# Patient Record
Sex: Female | Born: 1970 | Race: White | Hispanic: No | Marital: Single | Smoking: Never smoker
Health system: Southern US, Community
[De-identification: ages and names within clinical notes are randomized; demographics above are authoritative.]

## PROBLEM LIST (undated history)

## (undated) DIAGNOSIS — J302 Other seasonal allergic rhinitis: Secondary | ICD-10-CM

## (undated) DIAGNOSIS — T7840XA Allergy, unspecified, initial encounter: Secondary | ICD-10-CM

## (undated) DIAGNOSIS — C73 Malignant neoplasm of thyroid gland: Principal | ICD-10-CM

## (undated) DIAGNOSIS — R112 Nausea with vomiting, unspecified: Secondary | ICD-10-CM

## (undated) DIAGNOSIS — Z9889 Other specified postprocedural states: Secondary | ICD-10-CM

## (undated) HISTORY — DX: Allergy, unspecified, initial encounter: T78.40XA

## (undated) HISTORY — DX: Malignant neoplasm of thyroid gland: C73

---

## 1998-09-10 ENCOUNTER — Other Ambulatory Visit: Admission: RE | Admit: 1998-09-10 | Discharge: 1998-09-10 | Payer: Self-pay | Admitting: Obstetrics and Gynecology

## 1999-08-26 ENCOUNTER — Other Ambulatory Visit: Admission: RE | Admit: 1999-08-26 | Discharge: 1999-08-26 | Payer: Self-pay | Admitting: Obstetrics & Gynecology

## 2000-02-07 ENCOUNTER — Other Ambulatory Visit: Admission: RE | Admit: 2000-02-07 | Discharge: 2000-02-07 | Payer: Self-pay | Admitting: *Deleted

## 2000-09-07 ENCOUNTER — Inpatient Hospital Stay (HOSPITAL_COMMUNITY): Admission: AD | Admit: 2000-09-07 | Discharge: 2000-09-07 | Payer: Self-pay | Admitting: Obstetrics and Gynecology

## 2000-09-07 ENCOUNTER — Encounter: Payer: Self-pay | Admitting: Obstetrics and Gynecology

## 2000-09-21 ENCOUNTER — Inpatient Hospital Stay (HOSPITAL_COMMUNITY): Admission: AD | Admit: 2000-09-21 | Discharge: 2000-09-25 | Payer: Self-pay | Admitting: Obstetrics and Gynecology

## 2000-09-27 ENCOUNTER — Encounter: Admission: RE | Admit: 2000-09-27 | Discharge: 2000-10-27 | Payer: Self-pay | Admitting: Obstetrics and Gynecology

## 2000-10-01 ENCOUNTER — Inpatient Hospital Stay (HOSPITAL_COMMUNITY): Admission: AD | Admit: 2000-10-01 | Discharge: 2000-10-04 | Payer: Self-pay | Admitting: Obstetrics and Gynecology

## 2000-10-05 ENCOUNTER — Inpatient Hospital Stay (HOSPITAL_COMMUNITY): Admission: AD | Admit: 2000-10-05 | Discharge: 2000-10-05 | Payer: Self-pay | Admitting: Obstetrics and Gynecology

## 2000-10-06 ENCOUNTER — Inpatient Hospital Stay (HOSPITAL_COMMUNITY): Admission: AD | Admit: 2000-10-06 | Discharge: 2000-10-06 | Payer: Self-pay | Admitting: Obstetrics and Gynecology

## 2001-06-23 ENCOUNTER — Other Ambulatory Visit: Admission: RE | Admit: 2001-06-23 | Discharge: 2001-06-23 | Payer: Self-pay | Admitting: Obstetrics and Gynecology

## 2003-06-09 ENCOUNTER — Ambulatory Visit (HOSPITAL_COMMUNITY): Admission: RE | Admit: 2003-06-09 | Discharge: 2003-06-09 | Payer: Self-pay | Admitting: Obstetrics and Gynecology

## 2003-09-07 ENCOUNTER — Ambulatory Visit (HOSPITAL_COMMUNITY): Admission: RE | Admit: 2003-09-07 | Discharge: 2003-09-07 | Payer: Self-pay | Admitting: Obstetrics and Gynecology

## 2004-01-11 ENCOUNTER — Inpatient Hospital Stay (HOSPITAL_COMMUNITY): Admission: RE | Admit: 2004-01-11 | Discharge: 2004-01-13 | Payer: Self-pay | Admitting: Obstetrics and Gynecology

## 2004-02-20 ENCOUNTER — Other Ambulatory Visit: Admission: RE | Admit: 2004-02-20 | Discharge: 2004-02-20 | Payer: Self-pay | Admitting: Obstetrics and Gynecology

## 2005-05-21 ENCOUNTER — Other Ambulatory Visit: Admission: RE | Admit: 2005-05-21 | Discharge: 2005-05-21 | Payer: Self-pay | Admitting: Obstetrics and Gynecology

## 2011-03-28 ENCOUNTER — Other Ambulatory Visit: Payer: Self-pay | Admitting: Obstetrics and Gynecology

## 2011-03-28 DIAGNOSIS — E049 Nontoxic goiter, unspecified: Secondary | ICD-10-CM

## 2011-03-31 ENCOUNTER — Ambulatory Visit
Admission: RE | Admit: 2011-03-31 | Discharge: 2011-03-31 | Disposition: A | Discharge status: Home / Self Care | Payer: BC Managed Care – PPO | Source: Ambulatory Visit | Attending: Obstetrics and Gynecology | Admitting: Obstetrics and Gynecology

## 2011-03-31 DIAGNOSIS — E049 Nontoxic goiter, unspecified: Secondary | ICD-10-CM

## 2011-04-16 ENCOUNTER — Ambulatory Visit (INDEPENDENT_AMBULATORY_CARE_PROVIDER_SITE_OTHER): Payer: BC Managed Care – PPO | Admitting: General Surgery

## 2011-04-16 ENCOUNTER — Encounter (INDEPENDENT_AMBULATORY_CARE_PROVIDER_SITE_OTHER): Payer: Self-pay | Admitting: General Surgery

## 2011-04-16 VITALS — BP 134/96 | HR 60 | Temp 97.4°F | Resp 16 | Ht 64.0 in | Wt 232.0 lb

## 2011-04-16 DIAGNOSIS — E042 Nontoxic multinodular goiter: Secondary | ICD-10-CM

## 2011-04-16 HISTORY — PX: BIOPSY THYROID: PRO38

## 2011-04-16 NOTE — Progress Notes (Signed)
Chief Complaint  Patient presents with  . Other    new pt eval of thyroid    HPI Kathy Livingston is a 40 y.o. female.   HPI  40 year old Caucasian female referred by her gynecologist for evaluation of a multi-nodular thyroid. She initially developed a sore throat several weeks ago which prompted her to go to an urgent care clinic. She initially thought she had strep throat. She was placed on antibiotics. She states that the doctor noticed that her thyroid was enlarged and recommended that she followup with her primary care physician. She does not have a primary care physician so she followed up with her gynecologist. Her gynecologist was concerned about the size of her thyroid on physical exam so she was referred for an ultrasound of her neck which revealed bilateral thyroid nodules. Her thyroid function was also checked as well. She is here to discuss her thyroid nodules.  She denies any personal history of neck or chest radiation. She denies any personal or family history of thyroid cancer. Her maternal grandmother had breast cancer. Her father had non-Hodgkin's lymphoma. She denies any diarrhea, constipation, palpitations, weight change, irregular menstrual cycles, facial swelling, fatigue, anxiety, or dry skin. She does not have menstrual periods because of the Mirena IUD.  She denies any trouble swallowing liquids or solids. She denies any voice changes. She states that her neck just still doesn't feel right. She states that it feels like something is there in her neck.  History reviewed. No pertinent past medical history.  Past Surgical History  Procedure Date  . Cesarean section 09/22/00, 01/11/04    History reviewed. No pertinent family history.  Social History History  Substance Use Topics  . Smoking status: Never Smoker   . Smokeless tobacco: Never Used  . Alcohol Use: Yes    No Known Allergies  Current Outpatient Prescriptions  Medication Sig Dispense Refill  .  Pseudoephedrine HCl (SUDAFED PO) Take by mouth as needed.          Review of Systems Review of Systems  Constitutional: Negative for fever, chills, activity change, appetite change and unexpected weight change.  HENT:       See hpi  Eyes: Negative for photophobia and visual disturbance.  Respiratory: Negative for cough, choking, chest tightness and shortness of breath.   Cardiovascular: Negative for chest pain and leg swelling.  Gastrointestinal: Negative for abdominal pain, diarrhea and constipation.  Genitourinary: Negative for dysuria, frequency, vaginal bleeding and difficulty urinating.       Has IUD  Musculoskeletal: Negative.   Neurological: Negative.   Hematological: Negative.   Psychiatric/Behavioral: Negative.     Blood pressure 134/96, pulse 60, temperature 97.4 F (36.3 C), resp. rate 16, height 5\' 4"  (1.626 m), weight 232 lb (105.235 kg).  Physical Exam Physical Exam  Vitals reviewed. Constitutional: She is oriented to person, place, and time. She appears well-developed and well-nourished.       obese  HENT:  Head: Normocephalic and atraumatic.  Eyes: Conjunctivae are normal. No scleral icterus.  Neck: Normal range of motion. Neck supple. No JVD present. No tracheal tenderness present. No tracheal deviation present. Thyromegaly present. No mass present.  Cardiovascular: Normal rate and regular rhythm.   Pulmonary/Chest: Effort normal and breath sounds normal. No stridor. No respiratory distress. She has no wheezes.  Abdominal: Soft. Bowel sounds are normal. She exhibits no distension. There is no tenderness.  Musculoskeletal: Normal range of motion. She exhibits no edema.  Lymphadenopathy:    She  has no cervical adenopathy.  Neurological: She is alert and oriented to person, place, and time. She exhibits normal muscle tone.  Skin: Skin is warm and dry. No rash noted. No erythema.  Psychiatric: She has a normal mood and affect. Her behavior is normal. Judgment and  thought content normal.    Data Reviewed THYROID ULTRASOUND 03/31/11  Technique: Ultrasound examination of the thyroid gland and  adjacent soft tissues was performed.  Comparison: No comparison studies available.   Findings:  The right thyroid lobe measures 6.5 x 1.9 x 2.4 cm. The left lobe  measures 5.4 x 2.1 x 2.0 cm. The isthmus is 1.6 mm in thickness.  Thyroid parenchyma is diffusely heterogeneous. There are numerous  bilateral thyroid nodules.   Dominant right thyroid nodule is seen in the lower pole. This  measures 2.1 x 1.2 x 1.7 cm. It is solid without  microcalcification or hypoechoic halo. Other nodules in the right  lobe measure 13, 14, and 15 mm. These are all solid.   Dominant 2.4 x 1.6 x 2.4 cm solid nodule in the isthmus is noted.   2.2 x 1.3 x 1.6 cm solid nodule in the left lobe shows no evidence  for associated microcalcification.   mpression:  Enlarged thyroid with multiple prominent solid bilateral thyroid  nodules. These changes may be related to a multinodular goiter,  but some nodules meet criteria for a recommendation of tissue  sampling. This recommendation follows the consensus statement:  T3 total 92 TSH 4.44 (upper limits of nml) Free T4 0.99  Assessment    Multinodular Thyroid    Plan    The patient is euthyroid.  We discussed the etiology & management of thyroid nodules. She was given Transport planner.  She may have some mild compression symptoms. I have recommended an ultrasounded guided FNA biopsy of the three dominant thyroid nodules as a first step.  The biopsy results will help US guide where to go next i.e. Surgery vs medical management of her multinodular thyroid.  The main ultrasound feature of these nodules that is concerning is their size.  There are no associated microcalcifications which is reassuring.  She will f/u with me after her biopsy to discuss the results.  [name]. Andrey Campanile, MD, FACS       Gaynelle Adu M 04/16/2011,  11:57 AM

## 2011-04-16 NOTE — Patient Instructions (Signed)
Thyroid Diseases Your thyroid is a butterfly-shaped gland in your neck. It is located just above your collarbone. It is one of your endocrine glands, which make hormones. The thyroid helps set your metabolism. Metabolism is how your body gets energy from the foods you eat.  Millions of people have thyroid diseases. Women experience thyroid problems more often than men. In fact, overactive thyroid problems (hyperthyroidism) occur in 1% of all women. If you have a thyroid disease, your body may use energy more slowly or quickly than it should.  Thyroid problems also include an immune disease where your body reacts against your thyroid gland (called thyroiditis). A different problem involves lumps and bumps (called nodules) that develop in the gland. The nodules are usually, but not always, noncancerous. THE MOST COMMON THYROID PROBLEMS & CAUSES ARE DISCUSSED BELOW There are many causes for thyroid problems. Treatment depends upon the exact diagnosis and includes trying to reset your body's metabolism to a normal rate. Hyperthyroidism Too much thyroid hormone from an overactive thyroid gland is called hyperthyroidism. In hyperthyroidism, the body's metabolism speeds up. One of the most frequent forms of hyperthyroidism is known as Graves' disease. Graves' disease tends to run in families. Although Graves' is thought to be caused by a problem with the immune system, the exact nature of the genetic problem is unknown. Hypothyroidism Too little thyroid hormone from an underactive thyroid gland is called hypothyroidism. In hypothyroidism, the body's metabolism is slowed. Several things can cause this condition. Most causes affect the thyroid gland directly and hurt its ability to make enough hormone.  Rarely, there may be a pituitary gland tumor (located near the base of the brain). The tumor can block the pituitary from producing thyroid-stimulating hormone (TSH). Your body makes TSH to stimulate the thyroid to  work properly. If the pituitary does not make enough TSH, the thyroid fails to make enough hormones needed for good health. Whether the problem is caused by thyroid conditions or by the pituitary gland, the result is that the thyroid is not making enough hormones. Hypothyroidism causes many physical and mental processes to become sluggish. The body consumes less oxygen and produces less body heat. Thyroid Nodules A thyroid nodule is a small swelling or lump in the thyroid gland. They are common. These nodules represent either a growth of thyroid tissue or a fluid-filled cyst. Both form a lump in the thyroid gland. Almost half of all people will have tiny thyroid nodules at some point in their lives. Typically, these are not noticeable until they become large and affect normal thyroid size. Larger nodules that are greater than a half inch across (about 1 centimeter) occur in about 5 percent of people. Although most nodules are not cancerous, people who have them should seek medical care to rule out cancer. Also, some thyroid nodules may produce too much thyroid hormone or become too large. Large nodules or a large gland can interfere with breathing or swallowing or may cause neck discomfort. Other problems Other thyroid problems include cancer and thyroiditis. Thyroiditis is a malfunction of the body's immune system. Normally, the immune system works to defend the body against infection and other problems. When the immune system is not working properly, it may mistakenly attack normal cells, tissues, and organs. Examples of autoimmune diseases are Hashimoto's thyroiditis (which causes low thyroid function) and Graves' disease (which causes excess thyroid function). SYMPTOMS Symptoms vary greatly depending upon the exact type of problem with the thyroid. Hyperthyroidism-is when your thyroid is too active  and makes more thyroid hormone than your body needs. The most common cause is Graves' Disease. Too much  thyroid hormone can cause some or all of the following symptoms:  Anxiety.   Irritability.   Difficulty sleeping.   Fatigue.   A rapid or irregular heartbeat.   A fine tremor of your hands or fingers.   An increase in perspiration.   Sensitivity to heat.   Weight loss, despite normal food intake.   Brittle hair.   Enlargement of your thyroid gland (goiter).   Light menstrual periods.   Frequent bowel movements.  Graves' disease can specifically cause eye and skin problems. The skin problems involve reddening and swelling of the skin, often on your shins and on the top of your feet. Eye problems can include the following:  Excess tearing and sensation of grit or sand in either or both eyes.   Reddened or inflamed eyes.   Widening of the space between your eyelids.   Swelling of the lids and tissues around the eyes.   Light sensitivity.   Ulcers on the cornea.   Double vision.   Limited eye movements.   Blurred or reduced vision.  Hypothyroidism- is when your thyroid gland is not active enough. This is more common than hyperthyroidism. Symptoms can vary a lot depending of the severity of the hormone deficiency. Symptoms may develop over a long period of time and can include several of the following:  Fatigue.   Sluggishness.   Increased sensitivity to cold.   Constipation.   Pale, dry skin.   A puffy face.   Hoarse voice.   High blood cholesterol level.   Unexplained weight gain.   Muscle aches, tenderness and stiffness.   Pain, stiffness or swelling in your joints.   Muscle weakness.   Heavier than normal menstrual periods.   Brittle fingernails and hair.   Depression.  Thyroid Nodules - most do not cause signs or symptoms. Occasionally, some may become so large that you can feel or even see the swelling at the base of your neck. You may realize a lump or swelling is there when you are shaving or putting on makeup. Men might become aware of  a nodule when shirt collars suddenly feel too tight. Some nodules produce too much thyroid hormone. This can produce the same symptoms as hyperthyroidism (see above). Thyroid nodules are seldom cancerous. However, a nodule is more likely to be malignant (cancerous) if it:  Grows quickly or feels hard.   Causes you to become hoarse or to have trouble swallowing or breathing.   Causes enlarged lymph nodes under your jaw or in your neck.  DIAGNOSIS Because there are so many possible thyroid conditions, your caregiver may ask for a number of tests. They will do this in order to narrow down the exact diagnosis. These tests can include:  Blood and antibody tests.   Special thyroid scans using small, safe amounts of radioactive iodine.   Ultrasound of the thyroid gland (particularly if there is a nodule or lump).   Biopsy. This is usually done with a special needle. A needle biopsy is a procedure to obtain a sample of cells from the thyroid. The tissue will be tested in a lab and examined under a microscope.  TREATMENT Treatment depends on the exact diagnosis. Hyperthyroidism  Beta-blockers help relieve many of the symptoms.   Anti-thyroid medications prevent the thyroid from making excess hormones.   Radioactive iodine treatment can destroy overactive thyroid cells. The iodine  can permanently decrease the amount of hormone produced.   Surgery to remove the thyroid gland.   Treatments for eye problems that come from Graves' disease also include medications and special eye surgery, if felt to be appropriate.  Hypothyroidism Thyroid replacement with levothyroxine is the mainstay of treatment. Treatment with thyroid replacement is usually lifelong and will require monitoring and adjustment from time to time. Thyroid Nodules  Watchful waiting. If a small nodule causes no symptoms or signs of cancer on biopsy, then no treatment may be chosen at first. Re-exam and re-checking blood tests would  be the recommended follow-up.   Anti-thyroid medications or radioactive iodine treatment may be recommended if the nodules produce too much thyroid hormone (see Treatment for Hyperthyroidism above).   Alcohol ablation. Injections of small amounts of ethyl alcohol (ethanol) can cause a non-cancerous nodule to shrink in size.   Surgery (see Treatment for Hyperthyroidism above).  HOME CARE INSTRUCTIONS  Take medications as instructed.   Follow through on recommended testing.  SEEK MEDICAL CARE IF:  You feel that you are developing symptoms of Hyperthyroidism or Hypothyroidism as described above.   You develop a new lump/nodule in the neck/thyroid area that you had not noticed before.   You feel that you are having side effects from medicines prescribed.   You develop trouble breathing or swallowing.  SEEK IMMEDIATE MEDICAL CARE IF:  You develop a fever of 102 F (38.9 C) or higher.   You develop severe sweating.   You develop palpitations and/or rapid heart beat.   You develop shortness of breath.   You develop nausea and vomiting.   You develop extreme shakiness.   You develop agitation.   You develop lightheadedness or have a fainting episode.  Document Released: 04/20/2007 Document Re-Released: 09/17/2009 [facility] Patient Information 2011 [place], [place].

## 2011-04-17 NOTE — Progress Notes (Signed)
Addended byLiliana Cline on: 04/17/2011 04:33 PM   Modules accepted: Orders

## 2011-04-23 ENCOUNTER — Ambulatory Visit
Admission: RE | Admit: 2011-04-23 | Discharge: 2011-04-23 | Disposition: A | Discharge status: Home / Self Care | Payer: BC Managed Care – PPO | Source: Ambulatory Visit | Attending: General Surgery | Admitting: General Surgery

## 2011-04-23 ENCOUNTER — Other Ambulatory Visit (HOSPITAL_COMMUNITY)
Admission: RE | Admit: 2011-04-23 | Discharge: 2011-04-23 | Disposition: A | Discharge status: Home / Self Care | Payer: BC Managed Care – PPO | Source: Ambulatory Visit | Attending: Interventional Radiology | Admitting: Interventional Radiology

## 2011-04-23 DIAGNOSIS — E042 Nontoxic multinodular goiter: Secondary | ICD-10-CM

## 2011-04-23 DIAGNOSIS — E049 Nontoxic goiter, unspecified: Secondary | ICD-10-CM | POA: Insufficient documentation

## 2011-04-28 ENCOUNTER — Ambulatory Visit (INDEPENDENT_AMBULATORY_CARE_PROVIDER_SITE_OTHER): Payer: BC Managed Care – PPO | Admitting: Surgery

## 2011-04-29 ENCOUNTER — Telehealth (INDEPENDENT_AMBULATORY_CARE_PROVIDER_SITE_OTHER): Payer: Self-pay | Admitting: General Surgery

## 2011-04-29 NOTE — Telephone Encounter (Signed)
Left message on machine for patient to call back for path results. Needs appt with Dr Andrey Campanile to discuss follow up. Needs 6 month repeat Ultrasound and labs.

## 2011-04-29 NOTE — Telephone Encounter (Signed)
Patient made aware that path is benign. Patient has an appt to discuss follow up on 05/08/11.

## 2011-05-08 ENCOUNTER — Encounter (INDEPENDENT_AMBULATORY_CARE_PROVIDER_SITE_OTHER): Payer: BC Managed Care – PPO | Admitting: General Surgery

## 2011-05-13 ENCOUNTER — Encounter (INDEPENDENT_AMBULATORY_CARE_PROVIDER_SITE_OTHER): Payer: BC Managed Care – PPO | Admitting: General Surgery

## 2011-05-16 ENCOUNTER — Encounter (INDEPENDENT_AMBULATORY_CARE_PROVIDER_SITE_OTHER): Payer: BC Managed Care – PPO | Admitting: General Surgery

## 2011-06-05 ENCOUNTER — Encounter (INDEPENDENT_AMBULATORY_CARE_PROVIDER_SITE_OTHER): Payer: Self-pay | Admitting: General Surgery

## 2011-06-05 ENCOUNTER — Ambulatory Visit (INDEPENDENT_AMBULATORY_CARE_PROVIDER_SITE_OTHER): Payer: BC Managed Care – PPO | Admitting: General Surgery

## 2011-06-05 VITALS — BP 142/90 | HR 76 | Temp 97.7°F | Resp 18 | Ht 64.0 in | Wt 240.6 lb

## 2011-06-05 DIAGNOSIS — E042 Nontoxic multinodular goiter: Secondary | ICD-10-CM

## 2011-06-05 MED ORDER — LEVOTHYROXINE SODIUM 25 MCG PO TABS: 25.0000 ug | ORAL_TABLET | Freq: Every day | ORAL | Status: DC

## 2011-06-05 NOTE — Patient Instructions (Signed)
Start taking the synthroid to help with your thyroid function.  We will check your TSH level in 1 month just to make sure we haven't corrected your thyroid function too much.  We will repeat your ultrasound in 6 months

## 2011-06-05 NOTE — Progress Notes (Signed)
Chief Complaint  Patient presents with  . Routine Post Op    reck thyroid bx and give results 04/16/11    HPI Kathy Livingston is a 40 y.o. female.   HPI  40 year old Caucasian female referred by her gynecologist for evaluation of a multi-nodular thyroid. She initially developed a sore throat several weeks ago which prompted her to go to an urgent care clinic. She initially thought she had strep throat. She was placed on antibiotics. She states that the doctor noticed that her thyroid was enlarged and recommended that she followup with her primary care physician. She does not have a primary care physician so she followed up with her gynecologist. Her gynecologist was concerned about the size of her thyroid on physical exam so she was referred for an ultrasound of her neck which revealed bilateral thyroid nodules. Her thyroid function was also checked as well. She is here to discuss her thyroid nodules.  She denies any personal history of neck or chest radiation. She denies any personal or family history of thyroid cancer. Her maternal grandmother had breast cancer. Her father had non-Hodgkin's lymphoma. She denies any diarrhea, constipation, palpitations, weight change, irregular menstrual cycles, facial swelling, fatigue, anxiety, or dry skin. She does not have menstrual periods because of the Mirena IUD.  She denies any trouble swallowing liquids or solids. She denies any voice changes. She states that her neck just still doesn't feel right. She states that it feels like something is there in her neck.  After her last visit, an u/s guided FNA biopsy was performed and she is here to discuss the results.   History reviewed. No pertinent past medical history.  Past Surgical History  Procedure Date  . Cesarean section 09/22/00, 01/11/04  . Biopsy thyroid 04/16/11    Family History  Problem Relation Age of Onset  . Cancer Father     lymphoma  . Heart disease Paternal Grandmother   . Cancer  Paternal Grandfather     pancreatic and esophageal  . Heart disease Paternal Grandfather     Social History History  Substance Use Topics  . Smoking status: Never Smoker   . Smokeless tobacco: Never Used  . Alcohol Use: Yes    No Known Allergies  Current Outpatient Prescriptions  Medication Sig Dispense Refill  . Pseudoephedrine HCl (SUDAFED PO) Take by mouth as needed.        Marland Kitchen levothyroxine (LEVOTHROID) 25 MCG tablet Take 1 tablet (25 mcg total) by mouth daily.  30 tablet  6    Review of Systems Review of Systems  Constitutional: Negative for fever, chills, activity change, appetite change and unexpected weight change.  HENT:       See hpi  Eyes: Negative for photophobia and visual disturbance.  Respiratory: Negative for cough, choking, chest tightness and shortness of breath.   Cardiovascular: Negative for chest pain and leg swelling.  Gastrointestinal: Negative for abdominal pain, diarrhea and constipation.  Genitourinary: Negative for dysuria, frequency, vaginal bleeding and difficulty urinating.       Has IUD  Musculoskeletal: Negative.   Neurological: Negative.   Hematological: Negative.   Psychiatric/Behavioral: Negative.     Blood pressure 142/90, pulse 76, temperature 97.7 F (36.5 C), temperature source Temporal, resp. rate 18, height 5\' 4"  (1.626 m), weight 240 lb 9.6 oz (109.135 kg).  Physical Exam Physical Exam  Vitals reviewed. Constitutional: She is oriented to person, place, and time. She appears well-developed and well-nourished.       obese  HENT:  Head: Normocephalic and atraumatic.  Eyes: Conjunctivae are normal. No scleral icterus.  Neck: Normal range of motion. Neck supple. No JVD present. No tracheal tenderness present. No tracheal deviation present. Thyromegaly present. No mass present. No hematoma/bruising from recent bx.  Cardiovascular: Normal rate and regular rhythm.   Pulmonary/Chest: Effort normal and breath sounds normal. No stridor.  No respiratory distress. She has no wheezes.  Abdominal: Soft. Bowel sounds are normal. She exhibits no distension. There is no tenderness.  Musculoskeletal: Normal range of motion. She exhibits no edema.  Lymphadenopathy:    She has no cervical adenopathy.  Neurological: She is alert and oriented to person, place, and time. She exhibits normal muscle tone.  Skin: Skin is warm and dry. No rash noted. No erythema.  Psychiatric: She has a normal mood and affect. Her behavior is normal. Judgment and thought content normal.    Data Reviewed THYROID ULTRASOUND 03/31/11  Technique: Ultrasound examination of the thyroid gland and  adjacent soft tissues was performed.  Comparison: No comparison studies available.   Findings:  The right thyroid lobe measures 6.5 x 1.9 x 2.4 cm. The left lobe  measures 5.4 x 2.1 x 2.0 cm. The isthmus is 1.6 mm in thickness.  Thyroid parenchyma is diffusely heterogeneous. There are numerous  bilateral thyroid nodules.   Dominant right thyroid nodule is seen in the lower pole. This  measures 2.1 x 1.2 x 1.7 cm. It is solid without  microcalcification or hypoechoic halo. Other nodules in the right  lobe measure 13, 14, and 15 mm. These are all solid.   Dominant 2.4 x 1.6 x 2.4 cm solid nodule in the isthmus is noted.   2.2 x 1.3 x 1.6 cm solid nodule in the left lobe shows no evidence  for associated microcalcification.   mpression:  Enlarged thyroid with multiple prominent solid bilateral thyroid  nodules. These changes may be related to a multinodular goiter,  but some nodules meet criteria for a recommendation of tissue  sampling. This recommendation follows the consensus statement:  T3 total 92 TSH 4.44 (upper limits of nml) Free T4 0.99  BIOPSY: 05/01/11  Ultrasound was performed to localize and mark an adequate site for  the biopsy. The patient was then prepped and draped in a normal  sterile fashion. Local anesthesia was provided with 1%  lidocaine.  Using direct ultrasound guidance, 4 passes were made using 25 gauge  needles into the nodule within the isthmus of the thyroid.  Ultrasound was used to confirm needle placements on all occasions.  Specimens were sent to Pathology for analysis.  Complications: None  Findings: Sonographic evaluation of the thyroid gland shows  multiple areas of nodularity in both lobes and the isthmus. No  single area is more suspicious for malignancy based on sonographic  morphology. The largest nodule was chosen for biopsy which is the  midline nodule occupying the entire isthmus. This nodule was  remeasured today and is felt to be slightly larger than the  previous measurements obtained with maximal diameter of 2.6-2.7 cm.  This is clearly the largest nodule in the thyroid gland.   IMPRESSION:  Ultrasound guided needle aspirate biopsy performed of the largest  solid thyroid nodule occupying the isthmus. Based on  remeasurement, maximal diameter is 2.6-2.7 cm. A multitude of  other solid nodules are present bilaterally, none of which shows  more suspicious characteristics for malignancy.  PATHOLOGY: Benign, benign follicular   Assessment    Multinodular Thyroid  Plan     We discussed her biopsy results. Given the fact that there are no microcalcifications or other overt suspicious findings on her ultrasound and that the biopsy showed benign follicular cells I recommended close clinical followup in 6 months with a repeat ultrasound at that time. We also discussed the possibility of proceeding with total thyroidectomy at this time. Given the other radiological findings and the FNA results I do not believe a total thyroidectomy is warranted at this time.  Her TSH level was borderline elevated at 4.44. I recommended placing her on a low dose of Synthroid since she is borderline hypothyroid. I placed her on a 50 mcg daily dose. I will recheck a TSH level in one month. We discussed the  signs and symptoms of hyperthyroidism and hypothyroidism. I will see her in 6 months. A TSH level was ordered for one month from now. All of her questions were asked and answered.  [name]. Andrey Campanile, MD, FACS       Gaynelle Adu M 06/05/2011, 5:24 PM

## 2011-06-06 ENCOUNTER — Encounter (INDEPENDENT_AMBULATORY_CARE_PROVIDER_SITE_OTHER): Payer: Self-pay

## 2011-07-25 ENCOUNTER — Telehealth (INDEPENDENT_AMBULATORY_CARE_PROVIDER_SITE_OTHER): Payer: Self-pay

## 2011-07-25 NOTE — Telephone Encounter (Signed)
Pt called stating she had misplaced her lab order for TSH. I refaxed the orders to [facility] again.  She also was concerned because she feels the "node(s) may be growing".  I suggested she wait until her lab results were back and she could speak with Dr. Andrey Campanile or Lesly Rubenstein then about her concerns.

## 2011-07-28 ENCOUNTER — Other Ambulatory Visit (INDEPENDENT_AMBULATORY_CARE_PROVIDER_SITE_OTHER): Payer: Self-pay | Admitting: General Surgery

## 2011-07-29 ENCOUNTER — Telehealth (INDEPENDENT_AMBULATORY_CARE_PROVIDER_SITE_OTHER): Payer: Self-pay | Admitting: General Surgery

## 2011-07-29 NOTE — Telephone Encounter (Signed)
Message copied by Liliana Cline on Tue Jul 29, 2011 11:42 AM ------      Message from: Andrey Campanile, ERIC M      Created: Tue Jul 29, 2011 10:33 AM       pls call pt and tell her TSH level is better and more in the normal range

## 2011-07-29 NOTE — Telephone Encounter (Signed)
Called and made patient aware labs were better. She still wants to see Dr Andrey Campanile to talk about possible removal of thyroid. Feels nodules are bigger. I made appt for patient 09/19/11. She will call with any questions prior.

## 2011-09-05 LAB — HM MAMMOGRAPHY

## 2011-09-05 LAB — HM PAP SMEAR: HM Pap smear: NORMAL

## 2011-09-19 ENCOUNTER — Ambulatory Visit (INDEPENDENT_AMBULATORY_CARE_PROVIDER_SITE_OTHER): Payer: BC Managed Care – PPO | Admitting: General Surgery

## 2011-09-19 ENCOUNTER — Encounter (INDEPENDENT_AMBULATORY_CARE_PROVIDER_SITE_OTHER): Payer: Self-pay | Admitting: General Surgery

## 2011-09-19 VITALS — BP 136/90 | HR 80 | Temp 97.9°F | Resp 18 | Ht 64.0 in | Wt 230.2 lb

## 2011-09-19 DIAGNOSIS — E042 Nontoxic multinodular goiter: Secondary | ICD-10-CM

## 2011-09-19 NOTE — Patient Instructions (Signed)
i will call you with your neck ultrasound results and we will go from there

## 2011-09-20 NOTE — Progress Notes (Signed)
Chief Complaint  Patient presents with  . Thyroid Problem    reck thyroid    HPI Kathy Livingston is a 41 y.o. female.   HPI  41 year old Caucasian female referred by her gynecologist for evaluation of a multi-nodular thyroid. She comes in for followup. She was last seen on 06/05/11. At that time her TSH level was borderline high so we placed her on a low dose of synthroid. She has undergone a biopsy of her largest thyroid nodule which showed benign follicular cells.   She denies any trouble swallowing liquids or solids. She denies any voice changes. She states that she thinks her thyroid may be a little larger. She reports that she may have some hoarseness "when the weather changes".  PMHx, PSHx, SOCHx, FAMHx, ALL reviewed and unchanged  Past Medical History  Diagnosis Date  . Thyroid disease     thyroid nodules    Past Surgical History  Procedure Date  . Cesarean section 09/22/00, 01/11/04  . Biopsy thyroid 04/16/11    Family History  Problem Relation Age of Onset  . Cancer Father     lymphoma  . Heart disease Paternal Grandmother   . Cancer Paternal Grandfather     pancreatic and esophageal  . Heart disease Paternal Grandfather     Social History History  Substance Use Topics  . Smoking status: Never Smoker   . Smokeless tobacco: Never Used  . Alcohol Use: Yes    No Known Allergies  Current Outpatient Prescriptions  Medication Sig Dispense Refill  . levothyroxine (LEVOTHROID) 25 MCG tablet Take 1 tablet (25 mcg total) by mouth daily.  30 tablet  6  . Pseudoephedrine HCl (SUDAFED PO) Take by mouth as needed.          Review of Systems Review of Systems  Constitutional: Negative for fever, chills, activity change, appetite change and unexpected weight change.  HENT:       See hpi  Eyes: Negative for photophobia and visual disturbance.  Respiratory: Negative for cough, choking, chest tightness and shortness of breath.   Cardiovascular: Negative for chest  pain and leg swelling.  Gastrointestinal: Negative for abdominal pain, diarrhea and constipation.  Genitourinary: Negative for dysuria, frequency, vaginal bleeding and difficulty urinating.       Has IUD  Musculoskeletal: Negative.   Neurological: Negative.   Hematological: Negative.   Psychiatric/Behavioral: Negative.     Blood pressure 136/90, pulse 80, temperature 97.9 F (36.6 C), temperature source Temporal, resp. rate 18, height 5\' 4"  (1.626 m), weight 230 lb 3.2 oz (104.418 kg).  Physical Exam Physical Exam  Vitals reviewed. Constitutional: She is oriented to person, place, and time. She appears well-developed and well-nourished.       overweight  HENT:  Head: Normocephalic and atraumatic.  Eyes: Conjunctivae are normal. No scleral icterus.  Neck: Normal range of motion. Neck supple. No JVD present. No tracheal tenderness present. No tracheal deviation present. Thyromegaly present (more so on Rt side) thyroid may be a little larger today than on previous exam. No mass present. Cardiovascular: Normal rate and regular rhythm.   Pulmonary/Chest: Effort normal and breath sounds normal. No stridor. No respiratory distress. She has no wheezes.  Abdominal: Soft. Bowel sounds are normal. She exhibits no distension. There is no tenderness.  Musculoskeletal: Normal range of motion. She exhibits no edema.  Lymphadenopathy:    She has no cervical, occipital, auricular, suprclavicular adenopathy.  Neurological: She is alert and oriented to person, place, and time. She exhibits  normal muscle tone.  Skin: Skin is warm and dry. No rash noted. No erythema.  Psychiatric: She has a normal mood and affect. Her behavior is normal. Judgment and thought content normal.    Data Reviewed: THYROID ULTRASOUND 03/31/11  Technique: Ultrasound examination of the thyroid gland and  adjacent soft tissues was performed.  Comparison: No comparison studies available.   Findings:  The right thyroid lobe  measures 6.5 x 1.9 x 2.4 cm. The left lobe  measures 5.4 x 2.1 x 2.0 cm. The isthmus is 1.6 mm in thickness.  Thyroid parenchyma is diffusely heterogeneous. There are numerous  bilateral thyroid nodules.   Dominant right thyroid nodule is seen in the lower pole. This  measures 2.1 x 1.2 x 1.7 cm. It is solid without  microcalcification or hypoechoic halo. Other nodules in the right  lobe measure 13, 14, and 15 mm. These are all solid.   Dominant 2.4 x 1.6 x 2.4 cm solid nodule in the isthmus is noted.   2.2 x 1.3 x 1.6 cm solid nodule in the left lobe shows no evidence  for associated microcalcification.   mpression:  Enlarged thyroid with multiple prominent solid bilateral thyroid  nodules. These changes may be related to a multinodular goiter,  but some nodules meet criteria for a recommendation of tissue  sampling. This recommendation follows the consensus statement:  T3 total 92 TSH 4.44 (upper limits of nml) Free T4 0.99  BIOPSY: 05/01/11  Ultrasound was performed to localize and mark an adequate site for  the biopsy. The patient was then prepped and draped in a normal  sterile fashion. Local anesthesia was provided with 1% lidocaine.  Using direct ultrasound guidance, 4 passes were made using 25 gauge  needles into the nodule within the isthmus of the thyroid.  Ultrasound was used to confirm needle placements on all occasions.  Specimens were sent to Pathology for analysis.  Complications: None  Findings: Sonographic evaluation of the thyroid gland shows  multiple areas of nodularity in both lobes and the isthmus. No  single area is more suspicious for malignancy based on sonographic  morphology. The largest nodule was chosen for biopsy which is the  midline nodule occupying the entire isthmus. This nodule was  remeasured today and is felt to be slightly larger than the  previous measurements obtained with maximal diameter of 2.6-2.7 cm.  This is clearly the largest  nodule in the thyroid gland.   IMPRESSION:  Ultrasound guided needle aspirate biopsy performed of the largest  solid thyroid nodule occupying the isthmus. Based on  remeasurement, maximal diameter is 2.6-2.7 cm. A multitude of  other solid nodules are present bilaterally, none of which shows  more suspicious characteristics for malignancy.  PATHOLOGY: Benign, benign follicular   TSH level 07/28/11- 2.827  Assessment    Multinodular Thyroid with goiter    Plan     We will order a repeat neck ultrasound to re-evaluate her thyroid and thyroid nodules. If there are new suspicious findings (ie calcifications), I will recommend a repeat biopsy.    She's interested in thyroidectomy at this time.  She would like to wait until the summer so she will not have take time off of work. I recommended to her that we first get the repeat neck u/s. IF there is anything suspicious, then I would recommend proceeding to the OR sooner once the appropriate work-up is compete.  If the ultrasound is reassuring, then I believe waiting till the summer is not  unreasonable.  We will contact her with the results of her u/s and make the appropriate f/u plan based on the results.   [name]. Andrey Campanile, MD, FACS General, Bariatric, & Minimally Invasive Surgery [facility], [place]        [facility] M 09/20/2011, 1:48 PM

## 2011-09-22 ENCOUNTER — Telehealth (INDEPENDENT_AMBULATORY_CARE_PROVIDER_SITE_OTHER): Payer: Self-pay

## 2011-09-22 NOTE — Telephone Encounter (Signed)
Called pt to notify her of the scheduled thyroid US appt that we scheduled at [facility] for 09-24-11 arrive at 4:15 for 4:30.

## 2011-09-24 ENCOUNTER — Other Ambulatory Visit: Payer: BC Managed Care – PPO

## 2011-09-26 ENCOUNTER — Ambulatory Visit
Admission: RE | Admit: 2011-09-26 | Discharge: 2011-09-26 | Disposition: A | Discharge status: Home / Self Care | Payer: BC Managed Care – PPO | Source: Ambulatory Visit | Attending: General Surgery | Admitting: General Surgery

## 2011-09-26 DIAGNOSIS — E042 Nontoxic multinodular goiter: Secondary | ICD-10-CM

## 2011-10-02 ENCOUNTER — Telehealth (INDEPENDENT_AMBULATORY_CARE_PROVIDER_SITE_OTHER): Payer: Self-pay | Admitting: General Surgery

## 2011-10-02 NOTE — Telephone Encounter (Signed)
Message copied by Liliana Cline on Thu Oct 02, 2011 11:18 AM ------      Message from: Kathy Livingston      Created: Thu Oct 02, 2011  9:25 AM      Regarding: test results       Lesly Rubenstein             Will you pls call pt with test results..  Use phone # in epic

## 2011-10-02 NOTE — Telephone Encounter (Signed)
Left message for patient to call back and ask for me 

## 2011-10-06 NOTE — Telephone Encounter (Signed)
Patient made aware no significant change on ultrasound. She will call us this summer when she is ready to schedule.

## 2011-10-15 ENCOUNTER — Encounter (INDEPENDENT_AMBULATORY_CARE_PROVIDER_SITE_OTHER): Payer: Self-pay | Admitting: General Surgery

## 2011-10-17 ENCOUNTER — Other Ambulatory Visit: Payer: Self-pay | Admitting: Obstetrics and Gynecology

## 2011-10-21 ENCOUNTER — Other Ambulatory Visit: Payer: Self-pay | Admitting: Obstetrics and Gynecology

## 2011-10-21 DIAGNOSIS — R928 Other abnormal and inconclusive findings on diagnostic imaging of breast: Secondary | ICD-10-CM

## 2011-10-22 ENCOUNTER — Ambulatory Visit
Admission: RE | Admit: 2011-10-22 | Discharge: 2011-10-22 | Disposition: A | Discharge status: Home / Self Care | Payer: BC Managed Care – PPO | Source: Ambulatory Visit | Attending: Obstetrics and Gynecology | Admitting: Obstetrics and Gynecology

## 2011-10-22 DIAGNOSIS — R928 Other abnormal and inconclusive findings on diagnostic imaging of breast: Secondary | ICD-10-CM

## 2011-10-23 ENCOUNTER — Other Ambulatory Visit: Payer: Self-pay | Admitting: Obstetrics and Gynecology

## 2011-10-23 DIAGNOSIS — N63 Unspecified lump in unspecified breast: Secondary | ICD-10-CM

## 2011-10-24 ENCOUNTER — Other Ambulatory Visit (INDEPENDENT_AMBULATORY_CARE_PROVIDER_SITE_OTHER): Payer: Self-pay | Admitting: General Surgery

## 2011-11-04 ENCOUNTER — Other Ambulatory Visit: Payer: BC Managed Care – PPO

## 2011-11-07 ENCOUNTER — Ambulatory Visit
Admission: RE | Admit: 2011-11-07 | Discharge: 2011-11-07 | Disposition: A | Discharge status: Home / Self Care | Payer: BC Managed Care – PPO | Source: Ambulatory Visit | Attending: Obstetrics and Gynecology | Admitting: Obstetrics and Gynecology

## 2011-11-07 ENCOUNTER — Other Ambulatory Visit: Payer: Self-pay | Admitting: Obstetrics and Gynecology

## 2011-11-07 DIAGNOSIS — N6002 Solitary cyst of left breast: Secondary | ICD-10-CM

## 2011-11-07 DIAGNOSIS — N63 Unspecified lump in unspecified breast: Secondary | ICD-10-CM

## 2011-12-06 DIAGNOSIS — C73 Malignant neoplasm of thyroid gland: Secondary | ICD-10-CM

## 2011-12-06 HISTORY — DX: Malignant neoplasm of thyroid gland: C73

## 2011-12-09 ENCOUNTER — Encounter (HOSPITAL_COMMUNITY): Payer: Self-pay | Admitting: Pharmacy Technician

## 2011-12-10 ENCOUNTER — Ambulatory Visit (INDEPENDENT_AMBULATORY_CARE_PROVIDER_SITE_OTHER): Payer: BC Managed Care – PPO | Admitting: General Surgery

## 2011-12-10 ENCOUNTER — Encounter (INDEPENDENT_AMBULATORY_CARE_PROVIDER_SITE_OTHER): Payer: Self-pay | Admitting: General Surgery

## 2011-12-10 VITALS — BP 138/80 | HR 84 | Resp 16 | Ht 64.0 in | Wt 222.0 lb

## 2011-12-10 DIAGNOSIS — E042 Nontoxic multinodular goiter: Secondary | ICD-10-CM

## 2011-12-10 NOTE — Progress Notes (Signed)
Patient ID: Kathy Livingston, female   DOB: 05-19-1971, 41 y.o.   MRN: [id number]  Chief Complaint  Patient presents with  . Pre-op Exam    thyroid    HPI Kathy Livingston is a 41 y.o. female.   HPI 41 yo WF comes in for LTF to discuss her upcoming total thyroid surgery for her multinodular goiter. She is still taking synthroid. She did have her first mammogram since her visit. It revealed an area of concern which prompted a biopsy which was benign. She denies any fever, chills, diarrhea, constipation, palpitations, new voice changes.    Past Medical History  Diagnosis Date  . Thyroid disease     thyroid nodules    Past Surgical History  Procedure Date  . Cesarean section 09/22/00, 01/11/04  . Biopsy thyroid 04/16/11    Family History  Problem Relation Age of Onset  . Cancer Father     lymphoma  . Heart disease Paternal Grandmother   . Cancer Paternal Grandfather     pancreatic and esophageal  . Heart disease Paternal Grandfather     Social History History  Substance Use Topics  . Smoking status: Never Smoker   . Smokeless tobacco: Never Used  . Alcohol Use: Yes    No Known Allergies  Current Outpatient Prescriptions  Medication Sig Dispense Refill  . acidophilus (RISAQUAD) CAPS Take 2 capsules by mouth daily.      Marland Kitchen levothyroxine (LEVOTHROID) 25 MCG tablet Take 1 tablet (25 mcg total) by mouth daily.  30 tablet  6  . Pseudoephedrine HCl (SUDAFED PO) Take 30 mg by mouth as needed.       . vitamin E 400 UNIT capsule Take 400 Units by mouth daily.        Review of Systems Review of Systems  Constitutional: Negative for fever, chills, activity change, appetite change and unexpected weight change.  HENT: Negative for hearing loss, facial swelling, sneezing, neck pain and postnasal drip.        Some allergy drainage a few weeks ago. Has raspy voice - unchanged  Eyes: Negative for photophobia, pain and visual disturbance.  Respiratory: Negative for chest tightness,  shortness of breath and wheezing.   Cardiovascular: Negative for chest pain and leg swelling.  Gastrointestinal: Negative for nausea, abdominal pain, diarrhea and abdominal distention.  Genitourinary: Negative for dysuria and difficulty urinating.  Musculoskeletal: Negative for back pain.  Skin: Negative for color change and pallor.  Neurological: Negative for tremors, seizures, syncope and light-headedness.  Hematological: Negative for adenopathy. Does not bruise/bleed easily.  Psychiatric/Behavioral: Negative for sleep disturbance and self-injury.    Blood pressure 138/80, pulse 84, resp. rate 16, height 5\' 4"  (1.626 m), weight 222 lb (100.699 kg).  Physical Exam Physical Exam  Vitals reviewed. Constitutional: She is oriented to person, place, and time. She appears well-developed and well-nourished. No distress.  HENT:  Head: Normocephalic and atraumatic.  Right Ear: External ear normal.  Left Ear: External ear normal.  Nose: Nose normal.       Raspy voice  Eyes: Conjunctivae are normal. No scleral icterus.  Neck: Normal range of motion. Neck supple. No tracheal deviation present. Thyromegaly (R>L; mobile) present.    Cardiovascular: Normal rate, regular rhythm and normal heart sounds.   Pulmonary/Chest: Effort normal and breath sounds normal. No stridor. No respiratory distress. She has no wheezes.  Abdominal: Soft. She exhibits no distension.  Musculoskeletal: Normal range of motion. She exhibits no edema and no tenderness.  Lymphadenopathy:  She has no cervical adenopathy.  Neurological: She is alert and oriented to person, place, and time.  Skin: Skin is warm and dry. No rash noted. She is not diaphoretic. No erythema.  Psychiatric: She has a normal mood and affect. Her behavior is normal. Judgment and thought content normal.    Data Reviewed My last note Neck u/s from 03/2011 Thyroid bx results Repeat neck u/s 09/2011: THYROID ULTRASOUND  Technique: Ultrasound  examination of the thyroid gland and adjacent  soft tissues was performed.  Comparison: Motion dated 03/31/2011  Findings:   Right thyroid lobe: 7.4 x 2.0 x 2.4 cm.   Left thyroid lobe: 5.5 x 1.9 x 1.9 cm.   Isthmus: 1.6 cm.   Focal nodules: The patient has multiple solid nodules in the gland  including the isthmus.  The dominant nodule in the right lower pole measures 1.8 x 1.3 x  1.3 cm, slightly smaller than on the prior exam.  Isthmus: The solid nodule in the isthmus measures 2.7 x 1.7 x 2.6  cm., by my measurements essentially unchanged.  Left lobe: Nodule in the left lower pole measures 2.1 x 1.1 x 1.5  cm, essentially unchanged.  Lymphadenopathy: None visualized.   IMPRESSION:  No significant change in the multi nodular goiter. All of the  dominant nodules are essentially unchanged.   Assessment    Multinodular goiter - euthyroid    Plan    Plan total thyroidectomy next week for multinodular goiter.  The risks and benefits of the procedure have been discussed at length with the patient.  The patient understands the proposed procedure, potential alternative treatments, and the course of recovery to be expected.  All of the patient's questions have been answered at this time.  The patient wishes to proceed with surgery. We did discuss the need for life long thyroid replacement and generally the need for temporary sometimes permanent calcium supplementation.  I also explained that her voice will hopefully remain unchanged.   [name]. Andrey Campanile, MD, FACS General, Bariatric, & Minimally Invasive Surgery [facility], [place]        [facility] M 12/10/2011, 5:20 PM

## 2011-12-15 ENCOUNTER — Encounter (HOSPITAL_COMMUNITY)
Admission: RE | Admit: 2011-12-15 | Discharge: 2011-12-15 | Disposition: A | Discharge status: Home / Self Care | Payer: BC Managed Care – PPO | Source: Ambulatory Visit | Attending: General Surgery | Admitting: General Surgery

## 2011-12-15 ENCOUNTER — Encounter (HOSPITAL_COMMUNITY): Payer: Self-pay

## 2011-12-15 ENCOUNTER — Ambulatory Visit (HOSPITAL_COMMUNITY)
Admission: RE | Admit: 2011-12-15 | Discharge: 2011-12-15 | Disposition: A | Discharge status: Home / Self Care | Payer: BC Managed Care – PPO | Source: Ambulatory Visit | Attending: General Surgery | Admitting: General Surgery

## 2011-12-15 DIAGNOSIS — E042 Nontoxic multinodular goiter: Secondary | ICD-10-CM | POA: Insufficient documentation

## 2011-12-15 DIAGNOSIS — Z01812 Encounter for preprocedural laboratory examination: Secondary | ICD-10-CM | POA: Insufficient documentation

## 2011-12-15 HISTORY — DX: Other seasonal allergic rhinitis: J30.2

## 2011-12-15 HISTORY — DX: Other specified postprocedural states: R11.2

## 2011-12-15 HISTORY — DX: Other specified postprocedural states: Z98.890

## 2011-12-15 LAB — BASIC METABOLIC PANEL
Calcium: 9.3 mg/dL (ref 8.4–10.5)
GFR calc non Af Amer: >90 mL/min (ref >90)
Sodium: 141 mEq/L (ref 135–145)

## 2011-12-15 LAB — CBC
Platelets: 326 10*3/uL (ref 150–400)
RBC: 4.71 MIL/uL (ref 3.87–5.11)
RDW: 13.6 % (ref 11.5–15.5)
WBC: 9.4 10*3/uL (ref 4.0–10.5)

## 2011-12-15 LAB — DIFFERENTIAL
Basophils Absolute: 0 10*3/uL (ref 0.0–0.1)
Eosinophils Absolute: 0.1 10*3/uL (ref 0.0–0.7)
Lymphocytes Relative: 32 % (ref 12–46)
Lymphs Abs: 3 10*3/uL (ref 0.7–4.0)
Neutrophils Relative %: 61 % (ref 43–77)

## 2011-12-15 LAB — SURGICAL PCR SCREEN
MRSA, PCR: NEGATIVE
Staphylococcus aureus: POSITIVE — AB

## 2011-12-15 LAB — HCG, SERUM, QUALITATIVE: Preg, Serum: NEGATIVE

## 2011-12-15 NOTE — Pre-Procedure Instructions (Signed)
SURGERY IS TOMORROW--CHART TAKEN TO SHORT STAY WITH A NOTE THAT PT'S PCR, SERUM PREGNANCY, TSH AND BMET RESULTS PENDING.

## 2011-12-15 NOTE — Patient Instructions (Signed)
YOUR SURGERY IS SCHEDULED ON:  Tuesday  6/11  AT 7:30 AM  REPORT TO [facility] AT:  5:30 AM      PHONE # FOR SHORT STAY IS [phone]  DO NOT EAT OR DRINK ANYTHING AFTER MIDNIGHT THE NIGHT BEFORE YOUR SURGERY.  YOU MAY BRUSH YOUR TEETH, RINSE OUT YOUR MOUTH--BUT NO WATER, NO FOOD, NO CHEWING GUM, NO MINTS, NO CANDIES, NO CHEWING TOBACCO.  PLEASE TAKE THE FOLLOWING MEDICATIONS THE AM OF YOUR SURGERY WITH A FEW SIPS OF WATER:  LEVOTHYROXINE    IF YOU USE INHALERS--USE YOUR INHALERS THE AM OF YOUR SURGERY AND BRING INHALERS TO THE HOSPITAL -TAKE TO SURGERY.    IF YOU ARE DIABETIC:  DO NOT TAKE ANY DIABETIC MEDICATIONS THE AM OF YOUR SURGERY.  IF YOU TAKE INSULIN IN THE EVENINGS--PLEASE ONLY TAKE 1/2 NORMAL EVENING DOSE THE NIGHT BEFORE YOUR SURGERY.  NO INSULIN THE AM OF YOUR SURGERY.  IF YOU HAVE SLEEP APNEA AND USE CPAP OR BIPAP--PLEASE BRING THE MASK --NOT THE MACHINE-NOT THE TUBING   -JUST THE MASK. DO NOT BRING VALUABLES, MONEY, CREDIT CARDS.  CONTACT LENS, DENTURES / PARTIALS, GLASSES SHOULD NOT BE WORN TO SURGERY AND IN MOST CASES-HEARING AIDS WILL NEED TO BE REMOVED.  BRING YOUR GLASSES CASE, ANY EQUIPMENT NEEDED FOR YOUR CONTACT LENS. FOR PATIENTS ADMITTED TO THE HOSPITAL--CHECK OUT TIME THE DAY OF DISCHARGE IS 11:00 AM.  ALL INPATIENT ROOMS ARE PRIVATE - WITH BATHROOM, TELEPHONE, TELEVISION AND WIFI INTERNET. IF YOU ARE BEING DISCHARGED THE SAME DAY OF YOUR SURGERY--YOU CAN NOT DRIVE YOURSELF HOME--AND SHOULD NOT GO HOME ALONE BY TAXI OR BUS.  NO DRIVING OR OPERATING MACHINERY FOR 24 HOURS FOLLOWING ANESTHESIA / PAIN MEDICATIONS.                            SPECIAL INSTRUCTIONS:  CHLORHEXIDINE SOAP SHOWER (other brand names are Betasept and Hibiclens ) PLEASE SHOWER WITH CHLORHEXIDINE THE NIGHT BEFORE YOUR SURGERY AND THE AM OF YOUR SURGERY. DO NOT USE CHLORHEXIDINE ON YOUR FACE OR PRIVATE AREAS--YOU MAY USE YOUR NORMAL SOAP THOSE AREAS AND YOUR NORMAL SHAMPOO.    WOMEN SHOULD AVOID SHAVING UNDER ARMS AND SHAVING LEGS 48 HOURS BEFORE USING CHLORHEXIDINE TO AVOID SKIN IRRITATION.  DO NOT USE IF ALLERGIC TO CHLORHEXIDINE.  PLEASE READ OVER ANY  FACT SHEETS THAT YOU WERE GIVEN: MRSA INFORMATION

## 2011-12-16 ENCOUNTER — Ambulatory Visit (HOSPITAL_COMMUNITY): Payer: BC Managed Care – PPO | Admitting: Anesthesiology

## 2011-12-16 ENCOUNTER — Encounter (HOSPITAL_COMMUNITY)
Admission: RE | Disposition: A | Discharge status: Home / Self Care | Payer: Self-pay | Source: Ambulatory Visit | Attending: General Surgery

## 2011-12-16 ENCOUNTER — Ambulatory Visit (HOSPITAL_COMMUNITY)
Admission: RE | Admit: 2011-12-16 | Discharge: 2011-12-17 | Disposition: A | Discharge status: Home / Self Care | Payer: BC Managed Care – PPO | Source: Ambulatory Visit | Attending: General Surgery | Admitting: General Surgery

## 2011-12-16 ENCOUNTER — Encounter (HOSPITAL_COMMUNITY): Payer: Self-pay | Admitting: *Deleted

## 2011-12-16 ENCOUNTER — Encounter (HOSPITAL_COMMUNITY): Payer: Self-pay | Admitting: Anesthesiology

## 2011-12-16 DIAGNOSIS — E042 Nontoxic multinodular goiter: Secondary | ICD-10-CM | POA: Insufficient documentation

## 2011-12-16 DIAGNOSIS — Z01812 Encounter for preprocedural laboratory examination: Secondary | ICD-10-CM | POA: Insufficient documentation

## 2011-12-16 DIAGNOSIS — C73 Malignant neoplasm of thyroid gland: Secondary | ICD-10-CM

## 2011-12-16 DIAGNOSIS — Z79899 Other long term (current) drug therapy: Secondary | ICD-10-CM | POA: Insufficient documentation

## 2011-12-16 HISTORY — PX: THYROIDECTOMY: SHX17

## 2011-12-16 LAB — TSH: TSH: 2.89 u[IU]/mL (ref 0.350–4.500)

## 2011-12-16 SURGERY — THYROIDECTOMY
Anesthesia: General | Site: Neck | Wound class: Clean

## 2011-12-16 MED ORDER — BUPIVACAINE HCL (PF) 0.25 % IJ SOLN
INTRAMUSCULAR | Status: AC
  Filled 2011-12-16: qty 30

## 2011-12-16 MED ORDER — ONDANSETRON HCL 4 MG/2ML IJ SOLN: 4.0000 mg | Freq: Four times a day (QID) | INTRAMUSCULAR | Status: DC | PRN

## 2011-12-16 MED ORDER — FENTANYL CITRATE 0.05 MG/ML IJ SOLN
INTRAMUSCULAR | Status: DC | PRN
  Administered 2011-12-16: 100 ug via INTRAVENOUS
  Administered 2011-12-16 (×3): 50 ug via INTRAVENOUS
  Administered 2011-12-16: 25 ug via INTRAVENOUS
  Administered 2011-12-16: 100 ug via INTRAVENOUS
  Administered 2011-12-16: 50 ug via INTRAVENOUS
  Administered 2011-12-16: 25 ug via INTRAVENOUS

## 2011-12-16 MED ORDER — DIPHENHYDRAMINE HCL 50 MG/ML IJ SOLN
INTRAMUSCULAR | Status: AC
  Filled 2011-12-16: qty 1

## 2011-12-16 MED ORDER — LIDOCAINE HCL (CARDIAC) 20 MG/ML IV SOLN
INTRAVENOUS | Status: DC | PRN
  Administered 2011-12-16: 50 mg via INTRAVENOUS

## 2011-12-16 MED ORDER — ACETAMINOPHEN 10 MG/ML IV SOLN
INTRAVENOUS | Status: AC
  Filled 2011-12-16: qty 100

## 2011-12-16 MED ORDER — CHLORHEXIDINE GLUCONATE 4 % EX LIQD: 1.0000 "application " | Freq: Once | CUTANEOUS | Status: DC

## 2011-12-16 MED ORDER — DIPHENHYDRAMINE HCL 50 MG/ML IJ SOLN
12.5000 mg | Freq: Once | INTRAMUSCULAR | Status: AC
  Administered 2011-12-16: 12.5 mg via INTRAVENOUS

## 2011-12-16 MED ORDER — NEOSTIGMINE METHYLSULFATE 1 MG/ML IJ SOLN
INTRAMUSCULAR | Status: DC | PRN
  Administered 2011-12-16: 4 mg via INTRAVENOUS

## 2011-12-16 MED ORDER — KCL IN DEXTROSE-NACL 20-5-0.45 MEQ/L-%-% IV SOLN
INTRAVENOUS | Status: DC
  Administered 2011-12-16 (×2): via INTRAVENOUS
  Filled 2011-12-16 (×3): qty 1000

## 2011-12-16 MED ORDER — GLYCOPYRROLATE 0.2 MG/ML IJ SOLN
INTRAMUSCULAR | Status: DC | PRN
  Administered 2011-12-16: .6 mg via INTRAVENOUS

## 2011-12-16 MED ORDER — LEVOTHYROXINE SODIUM 75 MCG PO TABS
75.0000 ug | ORAL_TABLET | Freq: Every day | ORAL | Status: DC
  Administered 2011-12-17: 75 ug via ORAL
  Filled 2011-12-16 (×2): qty 1

## 2011-12-16 MED ORDER — KCL IN DEXTROSE-NACL 20-5-0.45 MEQ/L-%-% IV SOLN
INTRAVENOUS | Status: AC
  Filled 2011-12-16: qty 1000

## 2011-12-16 MED ORDER — ROCURONIUM BROMIDE 100 MG/10ML IV SOLN
INTRAVENOUS | Status: DC | PRN
  Administered 2011-12-16: 10 mg via INTRAVENOUS
  Administered 2011-12-16: 45 mg via INTRAVENOUS

## 2011-12-16 MED ORDER — MUPIROCIN 2 % EX OINT
TOPICAL_OINTMENT | CUTANEOUS | Status: AC
  Filled 2011-12-16: qty 22

## 2011-12-16 MED ORDER — PROPOFOL 10 MG/ML IV BOLUS
INTRAVENOUS | Status: DC | PRN
  Administered 2011-12-16: 180 mg via INTRAVENOUS

## 2011-12-16 MED ORDER — DEXAMETHASONE SODIUM PHOSPHATE 10 MG/ML IJ SOLN
INTRAMUSCULAR | Status: DC | PRN
  Administered 2011-12-16: 8 mg via INTRAVENOUS

## 2011-12-16 MED ORDER — BUPIVACAINE-EPINEPHRINE PF 0.25-1:200000 % IJ SOLN
INTRAMUSCULAR | Status: DC | PRN
  Administered 2011-12-16: 14 mL

## 2011-12-16 MED ORDER — PHENOL 1.4 % MT LIQD
1.0000 | OROMUCOSAL | Status: DC | PRN
  Filled 2011-12-16: qty 177

## 2011-12-16 MED ORDER — FENTANYL CITRATE 0.05 MG/ML IJ SOLN
25.0000 ug | INTRAMUSCULAR | Status: DC | PRN
  Administered 2011-12-16 (×2): 50 ug via INTRAVENOUS

## 2011-12-16 MED ORDER — ONDANSETRON HCL 4 MG/2ML IJ SOLN
INTRAMUSCULAR | Status: DC | PRN
  Administered 2011-12-16: 4 mg via INTRAVENOUS

## 2011-12-16 MED ORDER — CEFAZOLIN SODIUM-DEXTROSE 2-3 GM-% IV SOLR
2.0000 g | INTRAVENOUS | Status: AC
  Administered 2011-12-16: 2 g via INTRAVENOUS

## 2011-12-16 MED ORDER — OXYCODONE-ACETAMINOPHEN 5-325 MG PO TABS
1.0000 | ORAL_TABLET | ORAL | Status: DC | PRN
  Administered 2011-12-16 – 2011-12-17 (×2): 1 via ORAL
  Filled 2011-12-16 (×2): qty 1

## 2011-12-16 MED ORDER — MORPHINE SULFATE 2 MG/ML IJ SOLN: 1.0000 mg | INTRAMUSCULAR | Status: DC | PRN

## 2011-12-16 MED ORDER — MENTHOL 3 MG MT LOZG
1.0000 | LOZENGE | OROMUCOSAL | Status: DC | PRN
  Filled 2011-12-16: qty 9

## 2011-12-16 MED ORDER — ACETAMINOPHEN 10 MG/ML IV SOLN
INTRAVENOUS | Status: DC | PRN
  Administered 2011-12-16: 1000 mg via INTRAVENOUS

## 2011-12-16 MED ORDER — CALCIUM CARBONATE ANTACID 500 MG PO CHEW
400.0000 mg | CHEWABLE_TABLET | Freq: Three times a day (TID) | ORAL | Status: DC
  Administered 2011-12-16 – 2011-12-17 (×2): 400 mg via ORAL
  Filled 2011-12-16 (×5): qty 2

## 2011-12-16 MED ORDER — MIDAZOLAM HCL 5 MG/5ML IJ SOLN
INTRAMUSCULAR | Status: DC | PRN
  Administered 2011-12-16: 2 mg via INTRAVENOUS

## 2011-12-16 MED ORDER — FENTANYL CITRATE 0.05 MG/ML IJ SOLN
INTRAMUSCULAR | Status: AC
  Filled 2011-12-16: qty 2

## 2011-12-16 MED ORDER — ONDANSETRON HCL 4 MG PO TABS: 4.0000 mg | ORAL_TABLET | Freq: Four times a day (QID) | ORAL | Status: DC | PRN

## 2011-12-16 MED ORDER — METOCLOPRAMIDE HCL 5 MG/ML IJ SOLN
INTRAMUSCULAR | Status: DC | PRN
  Administered 2011-12-16: 10 mg via INTRAVENOUS

## 2011-12-16 MED ORDER — LACTATED RINGERS IV SOLN
INTRAVENOUS | Status: DC | PRN
  Administered 2011-12-16 (×2): via INTRAVENOUS

## 2011-12-16 MED ORDER — BUPIVACAINE-EPINEPHRINE PF 0.25-1:200000 % IJ SOLN
INTRAMUSCULAR | Status: AC
  Filled 2011-12-16: qty 30

## 2011-12-16 SURGICAL SUPPLY — 48 items
APL SKNCLS STERI-STRIP NONHPOA (GAUZE/BANDAGES/DRESSINGS) ×1
ATTRACTOMAT 16X20 MAGNETIC DRP (DRAPES) ×2 IMPLANT
BENZOIN TINCTURE PRP APPL 2/3 (GAUZE/BANDAGES/DRESSINGS) ×2 IMPLANT
BLADE HEX COATED 2.75 (ELECTRODE) ×2 IMPLANT
BLADE SURG 15 STRL LF DISP TIS (BLADE) ×1 IMPLANT
BLADE SURG 15 STRL SS (BLADE) ×2
CANISTER SUCTION 2500CC (MISCELLANEOUS) ×2 IMPLANT
CHLORAPREP W/TINT 10.5 ML (MISCELLANEOUS) ×2 IMPLANT
CLIP TI MEDIUM 6 (CLIP) ×10 IMPLANT
CLIP TI WIDE RED SMALL 6 (CLIP) ×10 IMPLANT
CLOTH BEACON ORANGE TIMEOUT ST (SAFETY) ×2 IMPLANT
DISSECTOR ROUND CHERRY 3/8 STR (MISCELLANEOUS) ×2 IMPLANT
DRAPE PED LAPAROTOMY (DRAPES) ×2 IMPLANT
DRAPE UTILITY XL STRL (DRAPES) ×2 IMPLANT
DRESSING SURGICEL FIBRLLR 1X2 (HEMOSTASIS) ×1 IMPLANT
DRSG SURGICEL FIBRILLAR 1X2 (HEMOSTASIS) ×2
DRSG TEGADERM 4X4.75 (GAUZE/BANDAGES/DRESSINGS) ×2 IMPLANT
ELECT COATED BLADE 2.86 ST (ELECTRODE) ×2 IMPLANT
ELECT REM PT RETURN 9FT ADLT (ELECTROSURGICAL) ×2
ELECTRODE REM PT RTRN 9FT ADLT (ELECTROSURGICAL) ×1 IMPLANT
GAUZE SPONGE 4X4 16PLY XRAY LF (GAUZE/BANDAGES/DRESSINGS) ×4 IMPLANT
GLOVE BIOGEL M STRL SZ7.5 (GLOVE) ×2 IMPLANT
GLOVE BIOGEL PI IND STRL 7.0 (GLOVE) ×1 IMPLANT
GLOVE BIOGEL PI INDICATOR 7.0 (GLOVE) ×1
GLOVE INDICATOR 8.0 STRL GRN (GLOVE) ×2 IMPLANT
GLOVE SURG SIGNA 7.5 PF LTX (GLOVE) ×2 IMPLANT
GOWN STRL NON-REIN LRG LVL3 (GOWN DISPOSABLE) ×4 IMPLANT
GOWN STRL REIN XL XLG (GOWN DISPOSABLE) ×4 IMPLANT
KIT BASIN OR (CUSTOM PROCEDURE TRAY) ×2 IMPLANT
NEEDLE HYPO 22GX1.5 SAFETY (NEEDLE) ×2 IMPLANT
NS IRRIG 1000ML POUR BTL (IV SOLUTION) ×2 IMPLANT
PACK BASIC VI WITH GOWN DISP (CUSTOM PROCEDURE TRAY) ×2 IMPLANT
PEN SKIN MARKING BROAD (MISCELLANEOUS) ×2 IMPLANT
PENCIL BUTTON HOLSTER BLD 10FT (ELECTRODE) ×2 IMPLANT
SHEARS HARMONIC 9CM CVD (BLADE) ×2 IMPLANT
SPONGE GAUZE 4X4 12PLY (GAUZE/BANDAGES/DRESSINGS) ×2 IMPLANT
STAPLER VISISTAT 35W (STAPLE) ×2 IMPLANT
STRIP CLOSURE SKIN 1/2X4 (GAUZE/BANDAGES/DRESSINGS) ×2 IMPLANT
SUT MNCRL AB 4-0 PS2 18 (SUTURE) ×2 IMPLANT
SUT SILK 2 0 (SUTURE) ×2
SUT SILK 2-0 18XBRD TIE 12 (SUTURE) ×1 IMPLANT
SUT SILK 3 0 (SUTURE)
SUT SILK 3-0 18XBRD TIE 12 (SUTURE) IMPLANT
SUT VIC AB 3-0 SH 18 (SUTURE) ×2 IMPLANT
SYR BULB IRRIGATION 50ML (SYRINGE) ×2 IMPLANT
SYR CONTROL 10ML LL (SYRINGE) ×2 IMPLANT
TOWEL OR 17X26 10 PK STRL BLUE (TOWEL DISPOSABLE) ×2 IMPLANT
YANKAUER SUCT BULB TIP 10FT TU (MISCELLANEOUS) ×2 IMPLANT

## 2011-12-16 NOTE — Progress Notes (Signed)
Report given to Jacki Cones, R.N. For lunch relief

## 2011-12-16 NOTE — Anesthesia Preprocedure Evaluation (Signed)
Anesthesia Evaluation  Patient identified by MRN, date of birth, ID band Patient awake    Reviewed: Allergy & Precautions, H&P , NPO status , Patient's Chart, lab work & pertinent test results, reviewed documented beta blocker date and time   History of Anesthesia Complications (+) PONV  Airway Mallampati: II TM Distance: >3 FB Neck ROM: Full    Dental  (+) Teeth Intact and Dental Advisory Given   Pulmonary neg pulmonary ROS,  breath sounds clear to auscultation        Cardiovascular negative cardio ROS  Rhythm:Regular Rate:Normal     Neuro/Psych negative neurological ROS  negative psych ROS   GI/Hepatic negative GI ROS, Neg liver ROS,   Endo/Other  Goiter Denies dysphagia + hoarseness  Renal/GU negative Renal ROS  negative genitourinary   Musculoskeletal negative musculoskeletal ROS (+)   Abdominal   Peds negative pediatric ROS (+)  Hematology negative hematology ROS (+)   Anesthesia Other Findings   Reproductive/Obstetrics negative OB ROS                           Anesthesia Physical Anesthesia Plan  ASA: II  Anesthesia Plan: General   Post-op Pain Management:    Induction: Intravenous  Airway Management Planned: Oral ETT  Additional Equipment:   Intra-op Plan:   Post-operative Plan: Extubation in OR  Informed Consent: I have reviewed the patients History and Physical, chart, labs and discussed the procedure including the risks, benefits and alternatives for the proposed anesthesia with the patient or authorized representative who has indicated his/her understanding and acceptance.   Dental advisory given  Plan Discussed with: CRNA and Surgeon  Anesthesia Plan Comments:         Anesthesia Quick Evaluation

## 2011-12-16 NOTE — Progress Notes (Signed)
Medication for generalized itching all over given- no rash nor whelps noted.

## 2011-12-16 NOTE — H&P (View-Only) (Signed)
Patient ID: Kathy Livingston, female   DOB: 01/24/1971, 41 y.o.   MRN: [id number]  Chief Complaint  Patient presents with  . Pre-op Exam    thyroid    HPI Kathy Livingston is a 41 y.o. female.   HPI 41 yo WF comes in for LTF to discuss her upcoming total thyroid surgery for her multinodular goiter. She is still taking synthroid. She did have her first mammogram since her visit. It revealed an area of concern which prompted a biopsy which was benign. She denies any fever, chills, diarrhea, constipation, palpitations, new voice changes.    Past Medical History  Diagnosis Date  . Thyroid disease     thyroid nodules    Past Surgical History  Procedure Date  . Cesarean section 09/22/00, 01/11/04  . Biopsy thyroid 04/16/11    Family History  Problem Relation Age of Onset  . Cancer Father     lymphoma  . Heart disease Paternal Grandmother   . Cancer Paternal Grandfather     pancreatic and esophageal  . Heart disease Paternal Grandfather     Social History History  Substance Use Topics  . Smoking status: Never Smoker   . Smokeless tobacco: Never Used  . Alcohol Use: Yes    No Known Allergies  Current Outpatient Prescriptions  Medication Sig Dispense Refill  . acidophilus (RISAQUAD) CAPS Take 2 capsules by mouth daily.      . levothyroxine (LEVOTHROID) 25 MCG tablet Take 1 tablet (25 mcg total) by mouth daily.  30 tablet  6  . Pseudoephedrine HCl (SUDAFED PO) Take 30 mg by mouth as needed.       . vitamin E 400 UNIT capsule Take 400 Units by mouth daily.        Review of Systems Review of Systems  Constitutional: Negative for fever, chills, activity change, appetite change and unexpected weight change.  HENT: Negative for hearing loss, facial swelling, sneezing, neck pain and postnasal drip.        Some allergy drainage a few weeks ago. Has raspy voice - unchanged  Eyes: Negative for photophobia, pain and visual disturbance.  Respiratory: Negative for chest tightness,  shortness of breath and wheezing.   Cardiovascular: Negative for chest pain and leg swelling.  Gastrointestinal: Negative for nausea, abdominal pain, diarrhea and abdominal distention.  Genitourinary: Negative for dysuria and difficulty urinating.  Musculoskeletal: Negative for back pain.  Skin: Negative for color change and pallor.  Neurological: Negative for tremors, seizures, syncope and light-headedness.  Hematological: Negative for adenopathy. Does not bruise/bleed easily.  Psychiatric/Behavioral: Negative for sleep disturbance and self-injury.    Blood pressure 138/80, pulse 84, resp. rate 16, height 5' 4" (1.626 m), weight 222 lb (100.699 kg).  Physical Exam Physical Exam  Vitals reviewed. Constitutional: She is oriented to person, place, and time. She appears well-developed and well-nourished. No distress.  HENT:  Head: Normocephalic and atraumatic.  Right Ear: External ear normal.  Left Ear: External ear normal.  Nose: Nose normal.       Raspy voice  Eyes: Conjunctivae are normal. No scleral icterus.  Neck: Normal range of motion. Neck supple. No tracheal deviation present. Thyromegaly (R>L; mobile) present.    Cardiovascular: Normal rate, regular rhythm and normal heart sounds.   Pulmonary/Chest: Effort normal and breath sounds normal. No stridor. No respiratory distress. She has no wheezes.  Abdominal: Soft. She exhibits no distension.  Musculoskeletal: Normal range of motion. She exhibits no edema and no tenderness.  Lymphadenopathy:      She has no cervical adenopathy.  Neurological: She is alert and oriented to person, place, and time.  Skin: Skin is warm and dry. No rash noted. She is not diaphoretic. No erythema.  Psychiatric: She has a normal mood and affect. Her behavior is normal. Judgment and thought content normal.    Data Reviewed My last note Neck u/s from 03/2011 Thyroid bx results Repeat neck u/s 09/2011: THYROID ULTRASOUND  Technique: Ultrasound  examination of the thyroid gland and adjacent  soft tissues was performed.  Comparison: Motion dated 03/31/2011  Findings:   Right thyroid lobe: 7.4 x 2.0 x 2.4 cm.   Left thyroid lobe: 5.5 x 1.9 x 1.9 cm.   Isthmus: 1.6 cm.   Focal nodules: The patient has multiple solid nodules in the gland  including the isthmus.  The dominant nodule in the right lower pole measures 1.8 x 1.3 x  1.3 cm, slightly smaller than on the prior exam.  Isthmus: The solid nodule in the isthmus measures 2.7 x 1.7 x 2.6  cm., by my measurements essentially unchanged.  Left lobe: Nodule in the left lower pole measures 2.1 x 1.1 x 1.5  cm, essentially unchanged.  Lymphadenopathy: None visualized.   IMPRESSION:  No significant change in the multi nodular goiter. All of the  dominant nodules are essentially unchanged.   Assessment    Multinodular goiter - euthyroid    Plan    Plan total thyroidectomy next week for multinodular goiter.  The risks and benefits of the procedure have been discussed at length with the patient.  The patient understands the proposed procedure, potential alternative treatments, and the course of recovery to be expected.  All of the patient's questions have been answered at this time.  The patient wishes to proceed with surgery. We did discuss the need for life long thyroid replacement and generally the need for temporary sometimes permanent calcium supplementation.  I also explained that her voice will hopefully remain unchanged.   Danajah Birdsell M. Emileo Semel, MD, FACS General, Bariatric, & Minimally Invasive Surgery [facility] [place]        Morgan Keinath M 12/10/2011, 5:20 PM    

## 2011-12-16 NOTE — Op Note (Signed)
NAMECHENNEL, OLIVOS NO.:  [id number]  MEDICAL RECORD NO.:  [id number]  LOCATION:  1522                         FACILITY:  [facility]  PHYSICIAN:  [name]. Andrey Campanile, MD, FACSDATE OF BIRTH:  1970-08-24  DATE OF PROCEDURE: DATE OF DISCHARGE:                              OPERATIVE REPORT   PREOPERATIVE DIAGNOSIS:  Multinodular goiter.  POSTOPERATIVE DIAGNOSIS:  Multinodular goiter.  PROCEDURE:  Total thyroidectomy.  SURGEON:  [name]. Andrey Campanile, MD, FACS  ASSISTANT SURGEON:  Velora Heckler, MD, FACS  ANESTHESIA:  General plus local consisting of 0.25% Marcaine with epinephrine.  ESTIMATED BLOOD LOSS:  Minimal.  SPECIMEN:  Total thyroid - stitch marked right superior pole.  INDICATIONS FOR PROCEDURE:  The patient is a very pleasant 41 year old female who has a multinodular goiter.  The goiter has increased in size despite thyroid suppression medication.  She underwent a biopsy of the largest nodule, which demonstrated follicular cells.  She was symptomatic with respect to her goiter and she desired thyroidectomy. We discussed at length the risks and benefits, including but not limited to bleeding, infection, injury to surrounding structures, neck hematoma, scarring, blood clot formation, general anesthesia risk, injury to the recurrent laryngeal nerve, loss of voice projection, change in voice, the need for permanent thyroid supplementation and possible injury to the parathyroid glands and low calcium levels requiring calcium supplementation.  She elected to proceed with surgery.  DESCRIPTION OF PROCEDURE:  After obtaining informed consent, the patient was taken to the operating room #11 at [facility].  She was placed supine on the operating room table.  Sequential compression devices were placed.  General endotracheal anesthesia was established. A roll was placed behind her shoulders to slightly hyperextend her neck. Her neck and upper chest were prepped  and draped in the usual standard surgical manner with ChloraPrep.  A 6 cm Kocher incision was made about 1 fingerbreadth above the sternal notch.  I then divided the subcutaneous tissue.  The platysma was divided with electrocautery.  I then raised platysmal flaps both superior and inferiorly.  A self- retaining retractor was placed.  We then parted the median raphae.  The central compartment bilaterally contained no identifiable lymphadenopathy to palpation and inspection.  The patient had a nodule in the isthmus as well as in the right lobe.  Then beginning on the right, I sequentially mobilized the right thyroid lobe starting up at the superior pole.  I gradually rotated the lobe medially dividing its blood supply between clips and Harmonic scalpel.  The capsule was not entered during the dissection.  I identified and preserved the right superior parathyroid gland. The right inferior parathyroid gland was not identified.  We identified the recurrent laryngeal nerve on the right.  We also took down the ligament of Berry. When the right lobe was circumferentially mobilized I dissected the isthmus away from the anterior trachea taking the short pyramidal lobe in continuity.  This was again done with a combination of clips and Harmonic scalpel.  I then turned my attention to the left side of the neck.  The strap muscles were lifted off the left lobe of the thyroid. I then started my dissection at the  superior pole.  I identified and preserved the normal appearing left superior parathyroid gland, dissecting it free from the posterior thyroid capsule.  I identified and preserved the left recurrent laryngeal nerve as we rotated the gland medially.  Electrical current was not placed near the recurrent laryngeal nerve on the left.  We identified what appeared to be a left inferior parathyroid gland and preserved it.  The left inferior pole of the thyroid gland appeared to branch a little more  inferiorly and we isolated it from what appeared to be the parathyroid gland.  It was separated slightly.  This could be an isolated lymph node.  It was sent also with the specimen.  The remaining thyroid gland was then freed from the anterior trachea.  The stitch was placed at the superior pole on the right.  We then placed gauze in both the left and right neck.  We then irrigated the right neck and there was good evidence of hemostasis.  Thin pieces of Fibrillar were placed into the right neck cavity.  We irrigated the left neck cavity, there appeared to be excellent hemostasis.  Again some pieces of Fibrillar were placed.  I then reapproximated the median raphe with interrupted 3- 0 Vicryl sutures.  Local was placed into the subcutaneous tissue and deep dermis.  The platysma was reapproximated with inverted interrupted 3-0 Vicryl sutures and the skin was reapproximated with a running 4-0 Monocryl subcuticular fashion.  Benzoin, Steri-Strips, 4 x 4, and occlusive dressing were then applied.  All needle, instrument, sponge counts were correct x2.  There were no immediate complications.  The patient was extubated and taken to the recovery room in stable condition.  The patient tolerated the procedure well.     [name]. Andrey Campanile, MD, FACS     EMW/MEDQ  D:  12/16/2011  T:  12/16/2011  Job:  [id number]

## 2011-12-16 NOTE — Anesthesia Postprocedure Evaluation (Signed)
  Anesthesia Post-op Note  Patient: Kathy Livingston  Procedure(s) Performed: Procedure(s) (LRB): THYROIDECTOMY (N/A)  Patient Location: PACU  Anesthesia Type: General  Level of Consciousness: oriented and sedated  Airway and Oxygen Therapy: Patient Spontanous Breathing and Patient connected to nasal cannula oxygen  Post-op Pain: mild  Post-op Assessment: Post-op Vital signs reviewed, Patient's Cardiovascular Status Stable, Respiratory Function Stable and Patent Airway  Post-op Vital Signs: stable  Complications: No apparent anesthesia complications

## 2011-12-16 NOTE — Transfer of Care (Signed)
Immediate Anesthesia Transfer of Care Note  Patient: Kathy Livingston  Procedure(s) Performed: Procedure(s) (LRB): THYROIDECTOMY (N/A)  Patient Location: PACU  Anesthesia Type: General  Level of Consciousness: awake, oriented and patient cooperative  Airway & Oxygen Therapy: Patient Spontanous Breathing and Patient connected to face mask oxygen  Post-op Assessment: Report given to PACU RN, Post -op Vital signs reviewed and stable, Patient moving all extremities and Patient able to stick tongue midline  Post vital signs: Reviewed and stable  Complications: No apparent anesthesia complications

## 2011-12-16 NOTE — Interval H&P Note (Signed)
History and Physical Interval Note:  12/16/2011 7:19 AM  Kathy Livingston  has presented today for surgery, with the diagnosis of multinodular goiter   The various methods of treatment have been discussed with the patient and family. After consideration of risks, benefits and other options for treatment, the patient has consented to  Procedure(s) (LRB): THYROIDECTOMY (N/A) as a surgical intervention .  The patients' history has been reviewed, patient examined, no change in status, stable for surgery.  I have reviewed the patients' chart and labs.  Questions were answered to the patient's satisfaction.     [name]. Andrey Campanile, MD, FACS General, Bariatric, & Minimally Invasive Surgery [facility], [place]   [facility]

## 2011-12-16 NOTE — Brief Op Note (Signed)
12/16/2011  10:03 AM  PATIENT:  Kathy Livingston  41 y.o. female  PRE-OPERATIVE DIAGNOSIS:  multinodular goiter   POST-OPERATIVE DIAGNOSIS:  multinodular goiter   PROCEDURE:  Procedure(s) (LRB): TOTAL THYROIDECTOMY (N/A)  SURGEON:  Surgeon(s) and Role:    * Atilano Ina, MD,FACS - Primary  PHYSICIAN ASSISTANT: none  ASSISTANTS: Darnell Level, MD, FACS   ANESTHESIA:   general  EBL:  Total I/O In: 1000 [I.V.:1000] Out: 30 [Blood:30]  BLOOD ADMINISTERED:none  DRAINS: none   LOCAL MEDICATIONS USED:  MARCAINE     SPECIMEN:  Source of Specimen:  total thyroid, stitch right superior pole  DISPOSITION OF SPECIMEN:  PATHOLOGY  COUNTS:  YES  TOURNIQUET:  * No tourniquets in log *  DICTATION: .Other Dictation: Dictation Number 3 [id number] PLAN OF CARE: Admit for overnight observation  PATIENT DISPOSITION:  PACU - hemodynamically stable.   Delay start of Pharmacological VTE agent (>24hrs) due to surgical blood loss or risk of bleeding: yes  [name]. Andrey Campanile, MD, FACS General, Bariatric, & Minimally Invasive Surgery [facility], [place]

## 2011-12-17 ENCOUNTER — Encounter (HOSPITAL_COMMUNITY): Payer: Self-pay | Admitting: General Surgery

## 2011-12-17 LAB — BASIC METABOLIC PANEL
BUN: 8 mg/dL (ref 6–23)
CO2: 26 mEq/L (ref 19–32)
Calcium: 8 mg/dL — ABNORMAL LOW (ref 8.4–10.5)
Chloride: 103 mEq/L (ref 96–112)
Creatinine, Ser: 0.57 mg/dL (ref 0.50–1.10)
GFR calc Af Amer: >90 mL/min (ref >90)
GFR calc non Af Amer: >90 mL/min (ref >90)
Glucose, Bld: 111 mg/dL — ABNORMAL HIGH (ref 70–99)
Potassium: 3.7 mEq/L (ref 3.5–5.1)
Sodium: 137 mEq/L (ref 135–145)

## 2011-12-17 MED ORDER — CALCIUM CARBONATE ANTACID 500 MG PO CHEW: 1.0000 | CHEWABLE_TABLET | Freq: Every day | ORAL | Status: DC

## 2011-12-17 MED ORDER — LEVOTHYROXINE SODIUM 137 MCG PO TABS: 137.0000 ug | ORAL_TABLET | Freq: Every day | ORAL | Status: DC

## 2011-12-17 MED ORDER — OXYCODONE-ACETAMINOPHEN 5-325 MG PO TABS: 1.0000 | ORAL_TABLET | ORAL | Status: AC | PRN

## 2011-12-17 MED ORDER — CALCIUM CARBONATE ANTACID 500 MG PO CHEW: 2.0000 | CHEWABLE_TABLET | Freq: Every day | ORAL | Status: AC

## 2011-12-17 NOTE — Discharge Instructions (Signed)
[facility]      [facility], [place] [phone]  THYROID/ PARATHYROID SURGERY: POST OP INSTRUCTIONS  Always review your discharge instruction sheet given to you by the facility where your surgery was performed.  IF YOU HAVE DISABILITY OR FAMILY LEAVE FORMS, YOU MUST BRING THEM TO THE OFFICE FOR PROCESSING.  PLEASE DO NOT GIVE THEM TO YOUR DOCTOR.  Take two over the counter calcium tablets three times a day. This will result in a total daily dose of 1500-2000 mg of calcium, as most supplements have 500-600 mg elemental calcium per pill. You may take over-the-counter Citracal, TUMS, Viactiv, or any supplement that has 500-600 mg elemental calcium per pill. They are all equivalent. Please note that it is common for the calcium level to be low following removal of the thyroid, and you may experience numbness or tingling, which is a sign of low calcium. If this happens, it should improve when you take your calcium supplements. If it does not, please call your doctor.  1.  2. A prescription for pain medication may be given to you upon discharge.  Take your pain medication as prescribed, if needed.  If narcotic pain medicine is not needed, then you may take acetaminophen (Tylenol) or ibuprofen (Advil) as needed. 3. Take your usually prescribed medications unless otherwise directed. 4. If you need a refill on your pain medication, please contact your pharmacy. They will contact our office to request authorization.  Prescriptions will not be filled after 5pm or on week-ends. 5. You should follow a light diet the first 24 hours after arrival home, such as soup and crackers, etc.  Be sure to include lots of fluids daily.  Resume your normal diet the day after surgery. 6. Most patients will experience some swelling and bruising on the chest and neck area.  Ice packs will help.  Swelling and bruising can take several days to resolve.  7. It is common to experience some constipation if taking pain medication  after surgery.  Increasing fluid intake and taking a stool softener will usually help or prevent this problem from occurring.  A mild laxative (Milk of Magnesia or Miralax) should be taken according to package directions if there are no bowel movements after 48 hours. 8. Unless discharge instructions indicate otherwise, you may remove your bandages 48 hours after surgery, and you may shower at that time.  You may have steri-strips (small skin tapes) in place directly over the incision.  These strips should be left on the skin for 7-10 days.   9. ACTIVITIES:  You may resume regular (light) daily activities beginning the next day--such as daily self-care, walking, climbing stairs--gradually increasing activities as tolerated.  You may have sexual intercourse when it is comfortable.  Refrain from any heavy lifting or straining until approved by your doctor. a. You may drive when you no longer are taking prescription pain medication, you can comfortably wear a seatbelt, and you can safely maneuver your car and apply brakes b. RETURN TO WORK:  1 week 10. You should see your doctor in the office for a follow-up appointment approximately two weeks after your surgery.  Make sure that you call for this appointment within a day or two after you arrive home to insure a convenient appointment time. OTHER INSTRUCTIONS: see below  WHEN TO CALL YOUR DOCTOR: 1. Fever over 101.0 2. Inability to urinate 3. Nausea and/or vomiting 4. Extreme swelling or bruising 5. Continued bleeding from incision. 6. Increased pain, redness, or drainage from  the incision. 7. Difficulty swallowing or breathing 8. Muscle cramping or spasms. 9. Numbness or tingling in hands or feet or around lips.  The clinic staff is available to answer your questions during regular business hours.  Please don't hesitate to call and ask to speak to one of the nurses if you have concerns.  For further questions, please visit  [email or web address]

## 2011-12-17 NOTE — Progress Notes (Signed)
Pt discharged to home provided discharge instructions and prescriptions along with handouts. Pt verbalized understanding of discharge information. Pt stable. Pt transported by tech IV removed and documented. Chevelle Coulson Howell, RN    

## 2011-12-18 ENCOUNTER — Telehealth (INDEPENDENT_AMBULATORY_CARE_PROVIDER_SITE_OTHER): Payer: Self-pay | Admitting: General Surgery

## 2011-12-18 NOTE — Discharge Summary (Signed)
Physician Discharge Summary  Patient ID: Kathy Livingston MRN: [id number] DOB/AGE: Oct 04, 1970 41 y.o.  Admit date: 12/16/2011 Discharge date: 12/17/2011  Admission Diagnoses: Patient Active Problem List  Diagnosis  . Multinodular thyroid    Discharge Diagnoses:  Patient Active Problem List  Diagnosis  . Multinodular thyroid     Discharged Condition: good  Hospital Course: The patient was taken to the operating room for an elective total thyroidectomy for multinodular goiter. She was kept in the hospital overnight for observation. Her postoperative course was unremarkable. On POD 1, she was tolerating a diet, her vitals were stable, her voice was unchanged, & there was no sign of neck hematoma. Her postoperative Ca on the morning of discharge was 8 - she had no symptoms of hypocalcemia.  Consults: None  Significant Diagnostic Studies: labs: Ca 8  Treatments: IV hydration, pain meds, and surgery TOTAL THYROIDECTOMY 12/16/2011  Discharge Exam: Blood pressure 116/75, pulse 68, temperature 98.4 F (36.9 C), temperature source Oral, resp. rate 18, height 5\' 4"  (1.626 m), weight 219 lb (99.338 kg), SpO2 97.00%. Alert, nad cta Reg Soft, nd Neck - no hematoma, dressing c/d/i. No trouble swallowing. Voice unchanged.   Disposition: 01-Home or Self Care  Discharge Orders    Future Appointments: Provider: Department: Dept Phone: Center:   01/07/2012 8:45 AM Atilano Ina, MD,FACS [facility] [phone] None     Future Orders Please Complete By Expires   Diet - low sodium heart healthy      Increase activity slowly        Medication List  As of 12/18/2011  3:29 PM   TAKE these medications         acidophilus Caps   Take 2 capsules by mouth daily.      calcium carbonate 500 MG chewable tablet   Commonly known as: TUMS - dosed in mg elemental calcium   Chew 2 tablets (400 mg of elemental calcium total) by mouth daily. See discharge instructions regarding how and dosage  below      levothyroxine 137 MCG tablet   Commonly known as: SYNTHROID, LEVOTHROID   Take 1 tablet (137 mcg total) by mouth daily.      oxyCODONE-acetaminophen 5-325 MG per tablet   Commonly known as: PERCOCET   Take 1-2 tablets by mouth every 4 (four) hours as needed.      SUDAFED PO   Take 30 mg by mouth as needed. ONCE A DAY IF NEEDED FOR ALLERGIES      vitamin E 400 UNIT capsule   Take 400 Units by mouth daily.           Follow-up Information    Follow up with Atilano Ina, MD,FACS. Schedule an appointment as soon as possible for a visit in 2 weeks.   Contact information:   [employer] [address] [place] [address] [phone]         [name]. Andrey Campanile, MD, FACS General, Bariatric, & Minimally Invasive Surgery [facility], [place]  Signed: Atilano Ina 12/18/2011, 3:29 PM

## 2011-12-18 NOTE — Telephone Encounter (Signed)
Called pt to discuss path report. 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 No additional surgery needed. Will plan endocrine referral for surveillance.

## 2012-01-07 ENCOUNTER — Ambulatory Visit (INDEPENDENT_AMBULATORY_CARE_PROVIDER_SITE_OTHER): Payer: BC Managed Care – PPO | Admitting: General Surgery

## 2012-01-07 ENCOUNTER — Encounter (INDEPENDENT_AMBULATORY_CARE_PROVIDER_SITE_OTHER): Payer: Self-pay | Admitting: General Surgery

## 2012-01-07 VITALS — BP 132/82 | HR 84 | Temp 98.2°F | Resp 18 | Ht 64.0 in | Wt 219.1 lb

## 2012-01-07 DIAGNOSIS — C73 Malignant neoplasm of thyroid gland: Secondary | ICD-10-CM | POA: Insufficient documentation

## 2012-01-07 NOTE — Patient Instructions (Signed)
Get your lab work done early next week  Continue to take synthroid  Wear sunscreen over incision for at least the next 2 months

## 2012-01-07 NOTE — Progress Notes (Signed)
Subjective:     Patient ID: Kathy Livingston, female   DOB: Nov 23, 1970, 41 y.o.   MRN: [id number]  HPI 41 year old Caucasian female comes in for her postoperative check after undergoing a total thyroidectomy on June 11 for a multinodular  Goiter. She was kept in the hospital overnight for observation. She states that she is taking her Synthroid  Hormone. She is currently taking 137 mcg per day. She is no longer taking TUMS. She is taking a daily calcium supplement which she has been doing for years. She denies any fevers or chills. She denies any nausea or vomiting. She reports a good appetite. She denies any voice changes. She denies any perioral numbness or tingling. Her final pathology report showed a small focus of papillary thyroid cancer. She denies any difficulty swallowing.   Review of Systems     Objective:   Physical Exam BP 132/82  Pulse 84  Temp 98.2 F (36.8 C) (Temporal)  Resp 18  Ht 5\' 4"  (1.626 m)  Wt 219 lb 2 oz (99.394 kg)  BMI 37.61 kg/m2 Alert, nad nonfocal Neck - no masses, no LAD. Well healed transverse neck incision. Very small 'bulge' at midportion of incision. 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 She was given a copy of her pathology report. We discussed her long-term prognosis which is very good. I did explain that she would need surveillance for possible recurrence for the rest of her life. I explained that she needs no additional surgery at this time. However I would like to refer her to an endocrinologist to help manage her surveillance as well as to see if she would benefit or need any potential radioactive iodine treatment.   I advised her to continue taking her Synthroid on a daily basis. We need to check a calcium level as well as a TSH level. I advised her to  place and screen over her incision for at least the next 2 months in order to minimize scarring. I believe the small ulcer underneath the midportion of her incision is just a small probable hematoma which should resolve with time.  Followup 6 months  [name]. Andrey Campanile, MD, FACS General, Bariatric, & Minimally Invasive Surgery [facility], [place]

## 2012-01-09 ENCOUNTER — Encounter: Payer: Self-pay | Admitting: Endocrinology

## 2012-01-09 ENCOUNTER — Ambulatory Visit (INDEPENDENT_AMBULATORY_CARE_PROVIDER_SITE_OTHER): Payer: BC Managed Care – PPO | Admitting: Endocrinology

## 2012-01-09 ENCOUNTER — Ambulatory Visit: Payer: BC Managed Care – PPO | Admitting: Endocrinology

## 2012-01-09 VITALS — BP 110/82 | HR 76 | Temp 97.7°F | Ht 64.0 in | Wt 220.0 lb

## 2012-01-09 DIAGNOSIS — E890 Postprocedural hypothyroidism: Secondary | ICD-10-CM | POA: Insufficient documentation

## 2012-01-09 DIAGNOSIS — C73 Malignant neoplasm of thyroid gland: Secondary | ICD-10-CM

## 2012-01-09 NOTE — Patient Instructions (Addendum)
You have only a very small piece of thyroid cancer.  Some specialists recommend the radioactive iodine in this situation, and some do not.  If i was you, i would not take the radioactive iodine for this.  You can change your mind at any time.  Please let me know. For now, increase the levothyroxine to 150 mcg/day. Please recheck the thyroid blood test in 4-6 weeks.  You will receive a letter with results.  Please come back for a follow-up appointment in 6 months.

## 2012-01-09 NOTE — Progress Notes (Signed)
Subjective:    Patient ID: Kathy Livingston, female    DOB: [birth year], 41 y.o.   MRN: [id number]  HPI Pt says she was seen at urgent care for uri approx 8 mos ago.  She was incidentally noted to have a nodule at the anterior neck, but no assoc pain.  Due to moderate interval growth since then, she had thyroidectomy.  She was started on synthroid postoperatively.  Past Medical History  Diagnosis Date  . Seasonal allergies     PT IS HOARSE--ATTRIBUTES TO HER ALLERGIES  . PONV (postoperative nausea and vomiting)     N&V FOR 3 HOURS AFTER EPIDURAL FOR C-SECTION.  Marland Kitchen Thyroid cancer 12/2011    Papillary, 0.6cm, T1a    Past Surgical History  Procedure Date  . Cesarean section 09/22/00, 01/11/04  . Biopsy thyroid 04/16/11  . Thyroidectomy 12/16/2011    Procedure: THYROIDECTOMY;  Surgeon: Atilano Ina, MD,FACS;  Location: [facility];  Service: General;  Laterality: N/A;    History   Social History  . Marital Status: Single    Spouse Name: N/A    Number of Children: 2  . Years of Education: 35   Occupational History  . Teacher    Social History Main Topics  . Smoking status: Never Smoker   . Smokeless tobacco: Never Used  . Alcohol Use: Yes     OCCAS  . Drug Use: No  . Sexually Active:    Other Topics Concern  . Not on file   Social History Narrative   Regular exercise-yesCaffeine Use-yes    Current Outpatient Prescriptions on File Prior to Visit  Medication Sig Dispense Refill  . calcium carbonate (TUMS) 500 MG chewable tablet Chew 2 tablets (400 mg of elemental calcium total) by mouth daily. See discharge instructions regarding how and dosage below      . levothyroxine (SYNTHROID) 137 MCG tablet Take 1 tablet (137 mcg total) by mouth daily.  30 tablet  1  . acidophilus (RISAQUAD) CAPS Take 2 capsules by mouth daily.      . Pseudoephedrine HCl (SUDAFED PO) Take 30 mg by mouth as needed. ONCE A DAY IF NEEDED FOR ALLERGIES      . vitamin E 400 UNIT capsule Take 400 Units by  mouth daily.        No Known Allergies  Family History  Problem Relation Age of Onset  . Cancer Father     lymphoma  . Heart disease Paternal Grandmother   . Cancer Paternal Grandfather     pancreatic and esophageal  . Heart disease Paternal Grandfather   . Alcohol abuse Daughter   . Cancer Other     Breast Cancer-Grandparent  . Hypertension Other     Parent  no thyroid probs  BP 110/82  Pulse 76  Temp 97.7 F (36.5 C) (Oral)  Ht 5\' 4"  (1.626 m)  Wt 220 lb (99.791 kg)  BMI 37.76 kg/m2  SpO2 98%  Review of Systems denies depression, hair loss, cramps, sob, weight gain, difficulty with concentration, constipation, numbness, blurry vision, myalgias, dry skin, rhinorrhea, easy bruising, and syncope.     Objective:   Physical Exam VS: see vs page GEN: no distress HEAD: head: no deformity eyes: no periorbital swelling, no proptosis external nose and ears are normal mouth: no lesion seen Neck: a healing scar is present.  i do not appreciate a nodule in the thyroid or elsewhere in the neck CHEST WALL: no deformity LUNGS:  Clear to auscultation CV: reg  rate and rhythm, no murmur. ABD: abdomen is soft, nontender.  no hepatosplenomegaly.  not distended.  no hernia MUSCULOSKELETAL: muscle bulk and strength are grossly normal.  no obvious joint swelling.  gait is normal and steady EXTEMITIES: no deformity.  no ulcer on the feet.  feet are of normal color and temp.  no edema PULSES: dorsalis pedis intact bilat.   NEURO:  cn 2-12 grossly intact.   readily moves all 4's.  sensation is intact to touch on the feet.  No tremor. SKIN:  Normal texture and temperature.  No rash or suspicious lesion is visible.   NODES:  None palpable at the neck. PSYCH: alert, oriented x3.  Does not appear anxious nor depressed. Lab Results  Component Value Date   TSH 13.355* 01/07/2012  Pathology: PAPILLARY THYROID CARCINOMA, COLUMNAR CELL VARIANT, 0.6 CM, CONFINED WITHIN THYROID PARENCHYMA. -  MULTIPLE FOLLICULAR ADENOMAS AND BACKGROUND LYMPHOCYTIC THYROIDITIS. - RESECTION MARGINS, NEGATIVE FOR ATYPIA OR MALIGNANCY.    Assessment & Plan:  Stage-1 papillary adenocarcinoma of the thyroid.  Despite columnar cell shape, she has very low risk of recurrence, and could be followed with neck ultrasound.  Use of i-131 is controversial here.  i told patient that i would go with her choice.  i'm always happy to hear dr wilson's thoughts also.  Postsurgical hypothyroidism, needs increased rx. Postop hypocalcemia, mild and transient.

## 2012-02-12 ENCOUNTER — Telehealth: Payer: Self-pay

## 2012-02-12 NOTE — Telephone Encounter (Signed)
Pt is scheduled for lab follow up 08/09 and is requesting a call with results as soon as it is available. Pt will also need Rx.

## 2012-02-13 ENCOUNTER — Encounter: Payer: Self-pay | Admitting: Endocrinology

## 2012-02-13 ENCOUNTER — Other Ambulatory Visit (INDEPENDENT_AMBULATORY_CARE_PROVIDER_SITE_OTHER): Payer: BC Managed Care – PPO

## 2012-02-13 DIAGNOSIS — E890 Postprocedural hypothyroidism: Secondary | ICD-10-CM

## 2012-02-13 MED ORDER — LEVOTHYROXINE SODIUM 150 MCG PO TABS: 150.0000 ug | ORAL_TABLET | Freq: Every day | ORAL | Status: DC

## 2012-02-13 NOTE — Telephone Encounter (Signed)
R'cd result letter from [facility], pt informed of results via VM and to callback office with any questions/concerns. Also send rx for Levothyroxine to [facility] in [place], pt informed. Letter also mailed to pt.

## 2012-02-13 NOTE — Telephone Encounter (Signed)
Pt had lab work done today, will need RX today, out of meds. Please call into [facility] in [place]. Pt request to be notified when sent.

## 2012-07-21 ENCOUNTER — Encounter (INDEPENDENT_AMBULATORY_CARE_PROVIDER_SITE_OTHER): Payer: Self-pay | Admitting: General Surgery

## 2012-07-21 ENCOUNTER — Ambulatory Visit (INDEPENDENT_AMBULATORY_CARE_PROVIDER_SITE_OTHER): Payer: BC Managed Care – PPO | Admitting: General Surgery

## 2012-07-21 VITALS — BP 120/76 | HR 72 | Temp 97.8°F | Resp 16 | Ht 64.0 in | Wt 215.6 lb

## 2012-07-21 DIAGNOSIS — C73 Malignant neoplasm of thyroid gland: Secondary | ICD-10-CM

## 2012-07-21 NOTE — Patient Instructions (Signed)
We will get a neck ultrasound to monitor for recurrence

## 2012-07-21 NOTE — Progress Notes (Signed)
Subjective:     Patient ID: Kathy Livingston, female   DOB: [birth year], 42 y.o.   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 She states that she has been doing well. She states that she has been having some stress at school. She denies any fever, chills, nausea, vomiting, diarrhea or constipation. She denies any palpitations. She denies any new changes. She is taking her Synthroid on a daily basis. She states that she has to make a followup appointment with Dr. Everardo All regarding her thyroid medication monitoring. She denies any weight loss. She denies any lymphadenopathy.  PMHx, PSHx, SOCHx, FAMHx, ALL reviewed and unchanged  Review of Systems 10 point ROS negative except for above    Objective:   Physical Exam BP 120/76  Pulse 72  Temp 97.8 F (36.6 C) (Temporal)  Resp 16  Ht 5\' 4"  (1.626 m)  Wt 215 lb 9.6 oz (97.796 kg)  BMI 37.01 kg/m2  Gen: alert, NAD, non-toxic appearing Pupils: equal, no scleral icterus Pulm: Lungs clear to auscultation, symmetric chest rise CV: regular rate and rhythm Neck: well healed kocher incision. No swelling. No masses. No LAD Ext: no edema,  Skin: no rash, no jaundice     Assessment:     S/p total thyroidectomy 12/2011 Papillary thyroid cancer    Plan:     Clinically no evidence of disease. I encouraged her to schedule for followup with Dr. Everardo All to monitor her TSH level to make sure that her synthyroid dosage is appropriate. We will get a neck ultrasound in the next week or 2 as part of her surveillance. Followup six-month  [name]. Andrey Campanile, MD, FACS General, Bariatric, & Minimally Invasive Surgery [facility], [place]

## 2012-07-23 ENCOUNTER — Other Ambulatory Visit: Payer: BC Managed Care – PPO

## 2012-08-04 ENCOUNTER — Other Ambulatory Visit: Payer: Self-pay | Admitting: Endocrinology

## 2012-09-09 ENCOUNTER — Other Ambulatory Visit: Payer: Self-pay | Admitting: Endocrinology

## 2012-10-09 ENCOUNTER — Other Ambulatory Visit: Payer: Self-pay | Admitting: Endocrinology

## 2012-10-11 ENCOUNTER — Other Ambulatory Visit: Payer: Self-pay | Admitting: *Deleted

## 2012-10-11 MED ORDER — LEVOTHYROXINE SODIUM 150 MCG PO TABS: 150.0000 ug | ORAL_TABLET | Freq: Every day | ORAL | Status: DC

## 2012-10-20 ENCOUNTER — Telehealth (INDEPENDENT_AMBULATORY_CARE_PROVIDER_SITE_OTHER): Payer: Self-pay

## 2012-10-20 ENCOUNTER — Ambulatory Visit: Payer: BC Managed Care – PPO | Admitting: Endocrinology

## 2012-10-20 ENCOUNTER — Ambulatory Visit
Admission: RE | Admit: 2012-10-20 | Discharge: 2012-10-20 | Disposition: A | Discharge status: Home / Self Care | Payer: BC Managed Care – PPO | Source: Ambulatory Visit | Attending: General Surgery | Admitting: General Surgery

## 2012-10-20 DIAGNOSIS — C73 Malignant neoplasm of thyroid gland: Secondary | ICD-10-CM

## 2012-10-20 NOTE — Telephone Encounter (Signed)
Pt called and was given results of her [place] of head/neck.

## 2012-10-25 ENCOUNTER — Encounter: Payer: Self-pay | Admitting: Endocrinology

## 2012-10-25 ENCOUNTER — Ambulatory Visit (INDEPENDENT_AMBULATORY_CARE_PROVIDER_SITE_OTHER): Payer: BC Managed Care – PPO | Admitting: Endocrinology

## 2012-10-25 VITALS — BP 124/80 | HR 75 | Wt 217.0 lb

## 2012-10-25 DIAGNOSIS — E890 Postprocedural hypothyroidism: Secondary | ICD-10-CM

## 2012-10-25 LAB — TSH: TSH: 2.355 u[IU]/mL (ref 0.350–4.500)

## 2012-10-25 NOTE — Patient Instructions (Addendum)
blood tests are being requested for you today.  We'll contact you with results. Please return in 1 year. [facility] [address].

## 2012-10-25 NOTE — Progress Notes (Signed)
  Subjective:    Patient ID: Kathy Livingston, female    DOB: [birth year], 42 y.o.   MRN: [id number]  HPI In mid-2013, pt had thyroidectomy for rapid growth of a nodule.  Pathology showed: PAPILLARY THYROID CARCINOMA, COLUMNAR CELL VARIANT, 0.6 CM, CONFINED WITHIN THYROID PARENCHYMA.  Also there were MULTIPLE FOLLICULAR ADENOMAS AND BACKGROUND LYMPHOCYTIC THYROIDITIS.  She was started on synthroid postoperatively.  She feels well on 150 mcg/day.   Past Medical History  Diagnosis Date  . Seasonal allergies     PT IS HOARSE--ATTRIBUTES TO HER ALLERGIES  . PONV (postoperative nausea and vomiting)     N&V FOR 3 HOURS AFTER EPIDURAL FOR C-SECTION.  Marland Kitchen Thyroid cancer 12/2011    Papillary, 0.6cm, T1a    Past Surgical History  Procedure Laterality Date  . Cesarean section  09/22/00, 01/11/04  . Biopsy thyroid  04/16/11  . Thyroidectomy  12/16/2011    Procedure: THYROIDECTOMY;  Surgeon: Atilano Ina, MD,FACS;  Location: [facility];  Service: General;  Laterality: N/A;    History   Social History  . Marital Status: Single    Spouse Name: N/A    Number of Children: 2  . Years of Education: 11   Occupational History  . Teacher    Social History Main Topics  . Smoking status: Never Smoker   . Smokeless tobacco: Never Used  . Alcohol Use: Yes     Comment: OCCAS  . Drug Use: No  . Sexually Active:    Other Topics Concern  . Not on file   Social History Narrative   Regular exercise-yes   Caffeine Use-yes    Current Outpatient Prescriptions on File Prior to Visit  Medication Sig Dispense Refill  . acidophilus (RISAQUAD) CAPS Take 2 capsules by mouth daily.      . calcium carbonate (TUMS) 500 MG chewable tablet Chew 2 tablets (400 mg of elemental calcium total) by mouth daily. See discharge instructions regarding how and dosage below      . levothyroxine (SYNTHROID, LEVOTHROID) 150 MCG tablet Take 1 tablet (150 mcg total) by mouth daily before breakfast.  30 tablet  2  . Pseudoephedrine  HCl (SUDAFED PO) Take 30 mg by mouth as needed. ONCE A DAY IF NEEDED FOR ALLERGIES      . vitamin E 400 UNIT capsule Take 400 Units by mouth daily.       No current facility-administered medications on file prior to visit.    No Known Allergies  Family History  Problem Relation Age of Onset  . Cancer Father     lymphoma  . Heart disease Paternal Grandmother   . Cancer Paternal Grandfather     pancreatic and esophageal  . Heart disease Paternal Grandfather   . Alcohol abuse Daughter   . Cancer Other     Breast Cancer-Grandparent  . Hypertension Other     Parent    BP 124/80  Pulse 75  Wt 217 lb (98.431 kg)  BMI 37.23 kg/m2  SpO2 97%  Review of Systems Denies neck pain    Objective:   Physical Exam VITAL SIGNS:  See vs page GENERAL: no distress Neck: a healed scar is present.  i do not appreciate a nodule in the thyroid or elsewhere in the neck    Lab Results  Component Value Date   TSH 2.355 10/25/2012      Assessment & Plan:  Small focus of thyroid cancer, no further rx needed Postsurgical hypothyroidism, well-replaced

## 2012-10-28 ENCOUNTER — Other Ambulatory Visit: Payer: Self-pay | Admitting: Obstetrics and Gynecology

## 2012-11-10 ENCOUNTER — Other Ambulatory Visit: Payer: Self-pay | Admitting: Endocrinology

## 2012-12-14 ENCOUNTER — Other Ambulatory Visit: Payer: Self-pay

## 2012-12-14 MED ORDER — LEVOTHYROXINE SODIUM 150 MCG PO TABS: 150.0000 ug | ORAL_TABLET | Freq: Every day | ORAL | Status: DC

## 2013-07-01 IMAGING — US US SOFT TISSUE HEAD/NECK
1 series · 14 of 25 positions shown · non-contrast
Comparison: 09/26/2011

CLINICAL DATA: Status post complete thyroidectomy on 12/16/2011 for
multinodular goiter and focus of papillary carcinoma.

ULTRASOUND OF HEAD/NECK SOFT TISSUES
TECHNIQUE: Ultrasound examination of the head and neck soft
tissues was performed in the area of clinical concern.

[Series 1: us soft tissue head/neck · 0.08mm/px · 14 of 26 slices shown]
[im 1/26]
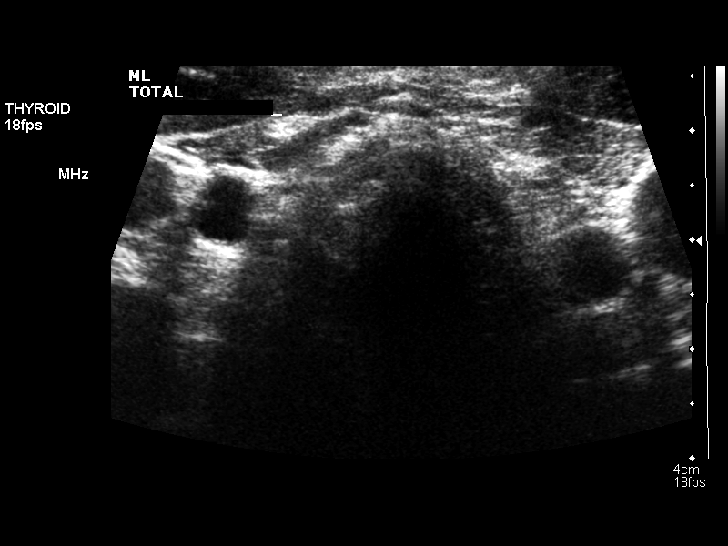
[im 3/26]
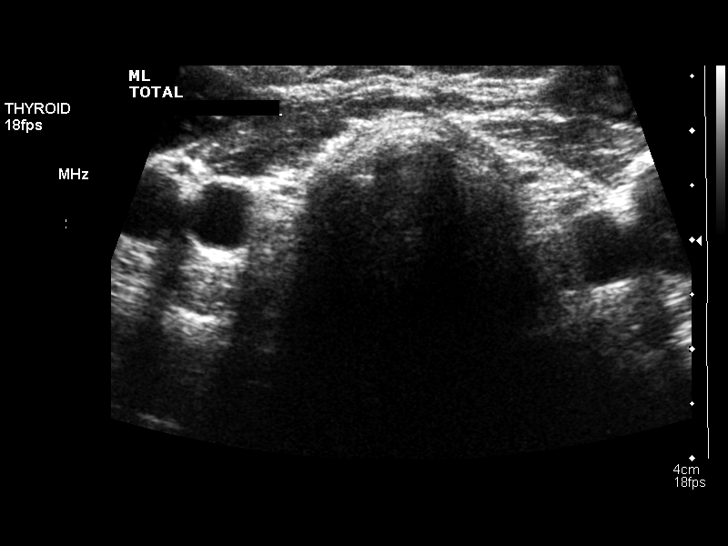
[im 5/26]
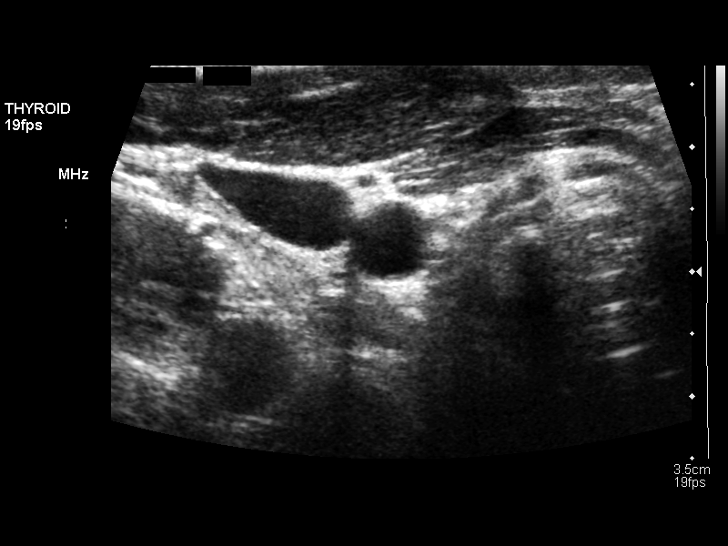
[im 7/26]
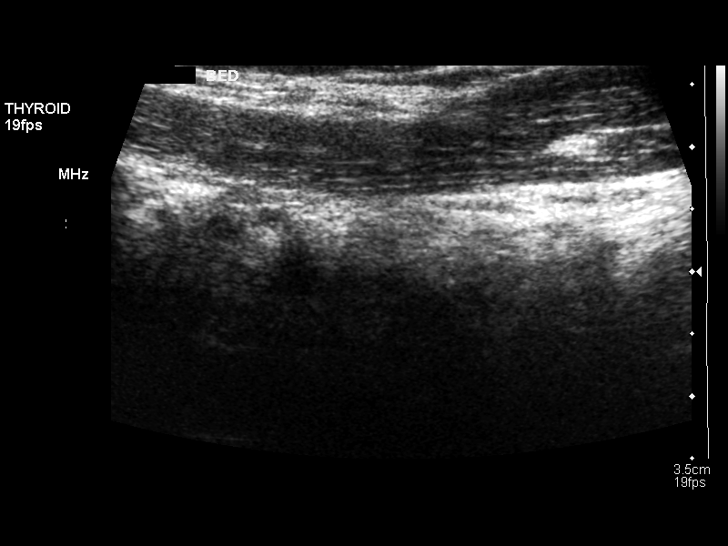
[im 9/26]
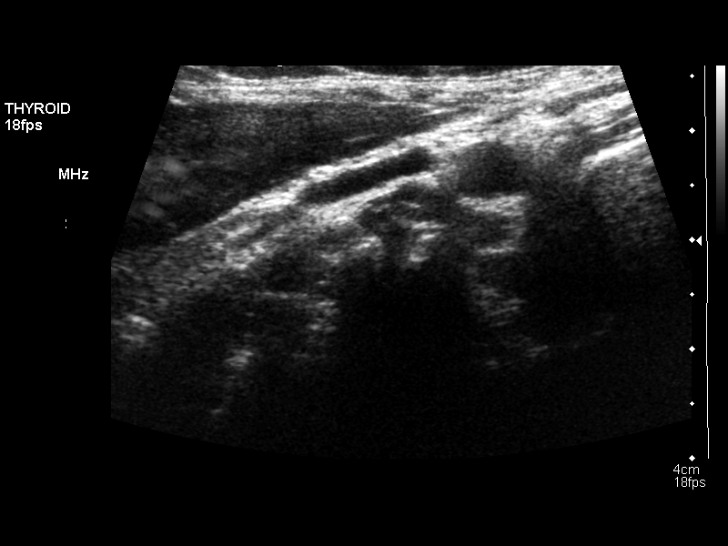
[im 10/26]
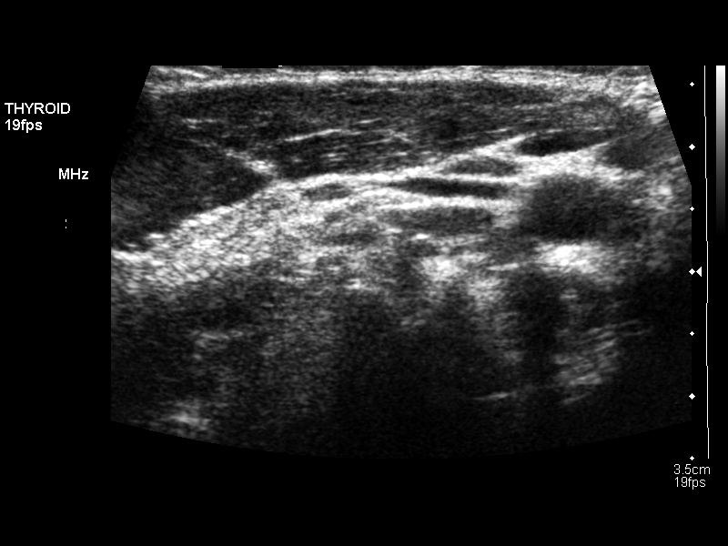
[im 12/26]
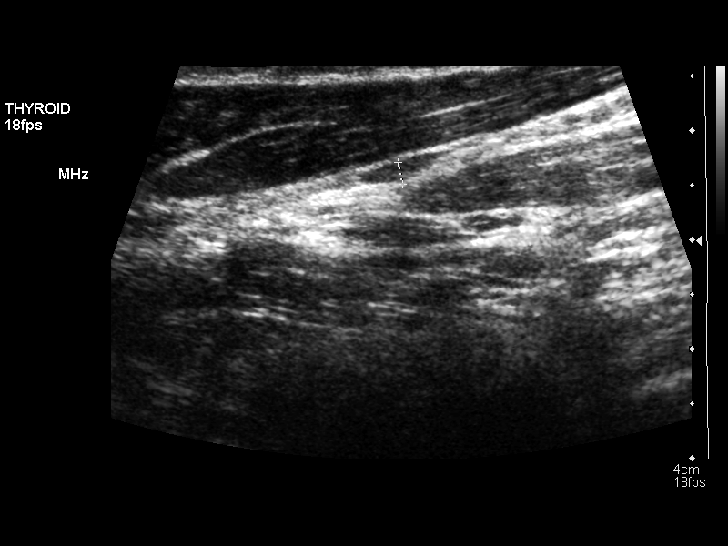
[im 14/26]
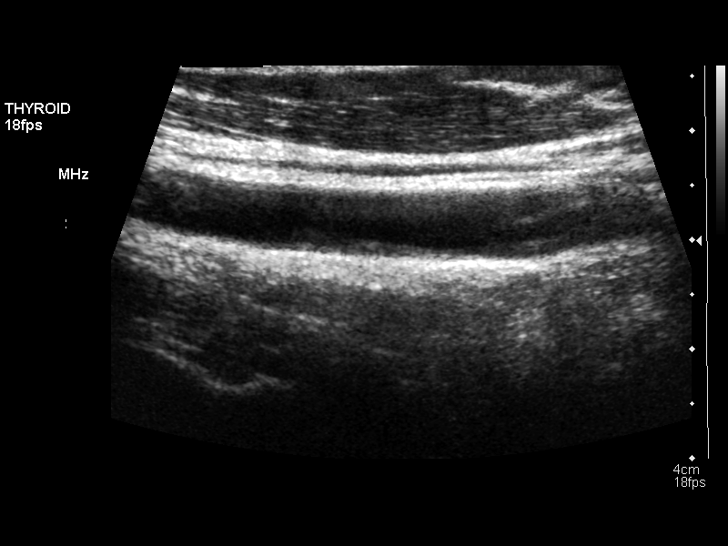
[im 16/26]
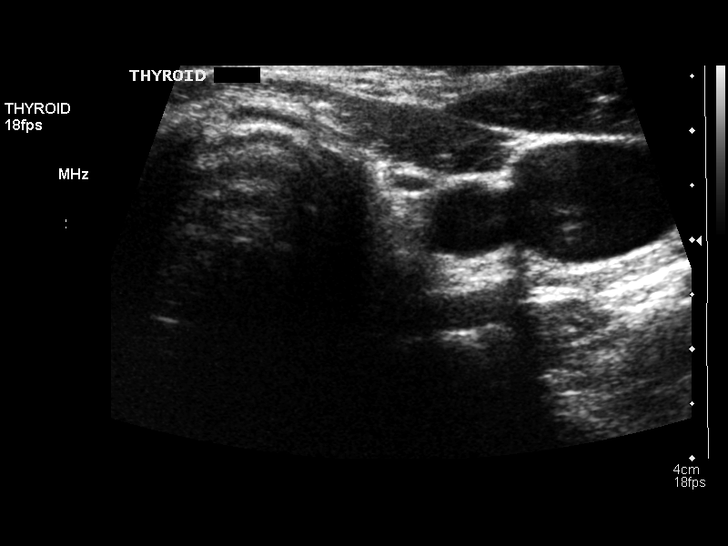
[im 17/26]
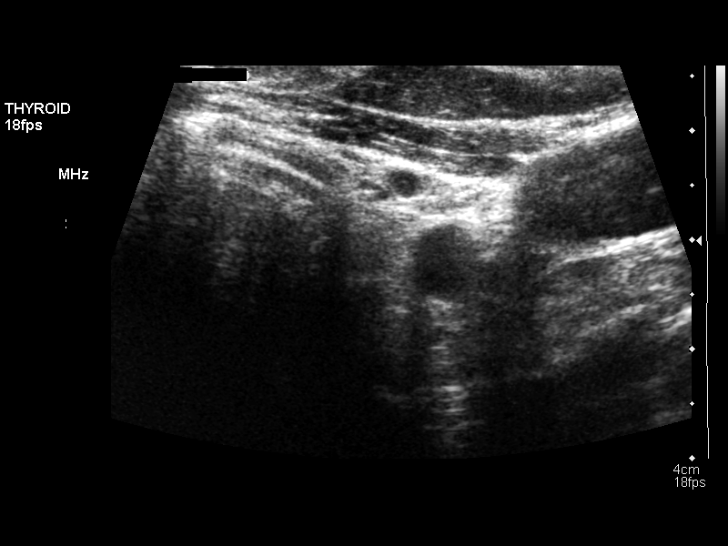
[im 19/26]
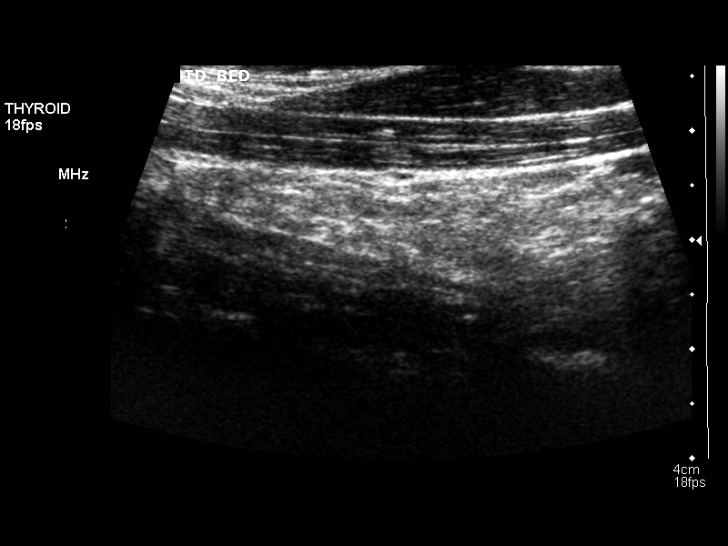
[im 21/26]
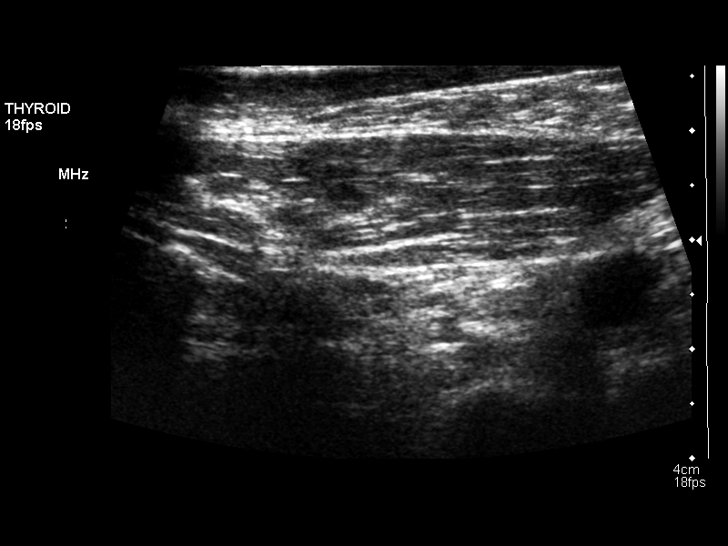
[im 23/26]
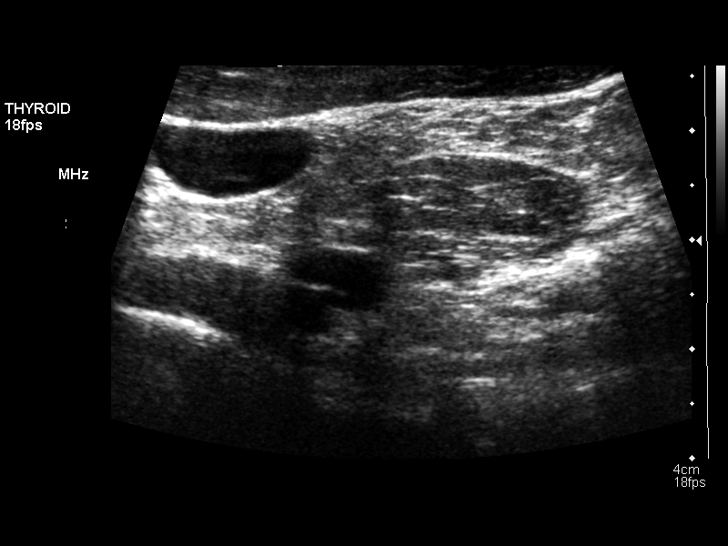
[im 26/26]
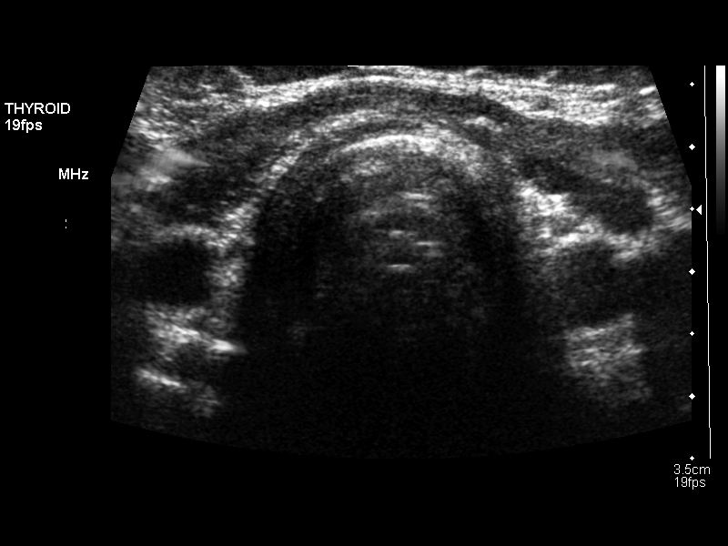

[14 of 25 positions shown; findings below may reference images not displayed]

FINDINGS: Sonography at the level of the the thyroid bed
demonstrates no evidence of residual visible thyroid tissue or
abnormal nodules.  No enlarged lymph nodes are identified.
IMPRESSION: Unremarkable neck ultrasound demonstrating no visible residual
thyroid tissue, abnormal nodules or enlarged lymph nodes.

## 2013-07-28 ENCOUNTER — Telehealth: Payer: Self-pay

## 2013-07-28 MED ORDER — LEVOTHYROXINE SODIUM 150 MCG PO TABS: 150.0000 ug | ORAL_TABLET | Freq: Every day | ORAL | Status: DC

## 2013-07-28 NOTE — Telephone Encounter (Signed)
Pt called requesting refill on synthroid medication refilled given x 2. Pt needs appointment for further refills.

## 2013-07-29 ENCOUNTER — Other Ambulatory Visit: Payer: Self-pay

## 2013-10-06 ENCOUNTER — Encounter (INDEPENDENT_AMBULATORY_CARE_PROVIDER_SITE_OTHER): Payer: Self-pay | Admitting: General Surgery

## 2013-10-06 ENCOUNTER — Ambulatory Visit (INDEPENDENT_AMBULATORY_CARE_PROVIDER_SITE_OTHER): Payer: BC Managed Care – PPO | Admitting: General Surgery

## 2013-10-06 VITALS — BP 128/81 | HR 78 | Temp 98.1°F | Resp 18 | Ht 64.0 in | Wt 234.4 lb

## 2013-10-06 DIAGNOSIS — C73 Malignant neoplasm of thyroid gland: Secondary | ICD-10-CM

## 2013-10-06 NOTE — Patient Instructions (Signed)
Follow up with Dr Loanne Drilling for lab surveillance

## 2013-10-06 NOTE — Progress Notes (Signed)
Subjective:     Patient ID: Kathy Livingston, female   DOB: 1970/07/15, 43 y.o.   MRN: [id number]  HPI 44 year old Caucasian female comes in for followup for her total thyroidectomy in June 2013 for a T1 A. Papillary thyroid cancer. She states that she has been doing well.She is getting ready to go on a spring break to [place].. She denies any fever, chills, nausea, vomiting, diarrhea or constipation. She denies any palpitations. She denies any new changes. She is taking her Synthroid on a daily basis. She states that she has to make a followup appointment with Dr. Loanne Drilling regarding her thyroid medication monitoring. She denies any weight loss. She denies any lymphadenopathy.  PMHx, PSHx, SOCHx, FAMHx, ALL reviewed and unchanged  Neck ultrasound from April 2014 was within normal limits. TSH from April 2014 was normal at 2.3 Review of Systems 10 point ROS negative except for above    Objective:   Physical Exam BP 128/81  Pulse 78  Temp(Src) 98.1 F (36.7 C) (Temporal)  Resp 18  Ht 5\' 4"  (1.626 m)  Wt 234 lb 6.4 oz (106.323 kg)  BMI 40.21 kg/m2  Gen: alert, NAD, non-toxic appearing Pupils: equal, no scleral icterus Pulm: Lungs clear to auscultation, symmetric chest rise CV: regular rate and rhythm Neck: well healed kocher incision. No swelling. No masses. No LAD Ext: no edema,  Skin: no rash, no jaundice     Assessment:     S/p total thyroidectomy 12/2011 Papillary thyroid cancer T1A    Plan:     Clinically no evidence of disease. I encouraged her to schedule for followup with Dr. Loanne Drilling to monitor her TSH level to make sure that her synthyroid dosage is appropriate. F/u prn since she is 2 yrs out from surgery  Kathy Ruff. Redmond Pulling, MD, FACS General, Bariatric, & Minimally Invasive Surgery [facility], [place]

## 2013-11-15 ENCOUNTER — Other Ambulatory Visit: Payer: Self-pay | Admitting: Obstetrics and Gynecology

## 2013-11-16 ENCOUNTER — Other Ambulatory Visit: Payer: Self-pay | Admitting: Endocrinology

## 2013-12-23 ENCOUNTER — Other Ambulatory Visit: Payer: Self-pay | Admitting: Endocrinology

## 2014-01-02 ENCOUNTER — Telehealth: Payer: Self-pay | Admitting: Endocrinology

## 2014-01-02 NOTE — Telephone Encounter (Signed)
Patient has an appt on July 8th to see Dr. Loanne Drilling She called in her rx of levothyroxine and she needs auth from provider   Please advise   Thank You

## 2014-01-02 NOTE — Telephone Encounter (Signed)
Please advise if ok to refill. Pt has not been seen since 2013,  Thanks!

## 2014-01-02 NOTE — Telephone Encounter (Signed)
Please refill x 1 Ov is due  

## 2014-01-03 MED ORDER — LEVOTHYROXINE SODIUM 150 MCG PO TABS: ORAL_TABLET | ORAL | Status: DC

## 2014-01-03 NOTE — Telephone Encounter (Signed)
Rx sent to pharmacy   

## 2014-01-11 ENCOUNTER — Encounter: Payer: Self-pay | Admitting: Endocrinology

## 2014-01-11 ENCOUNTER — Ambulatory Visit (INDEPENDENT_AMBULATORY_CARE_PROVIDER_SITE_OTHER): Payer: BC Managed Care – PPO | Admitting: Endocrinology

## 2014-01-11 VITALS — BP 120/82 | HR 64 | Temp 97.9°F | Ht 64.0 in | Wt 237.0 lb

## 2014-01-11 DIAGNOSIS — E890 Postprocedural hypothyroidism: Secondary | ICD-10-CM

## 2014-01-11 LAB — TSH: TSH: 10.95 u[IU]/mL — AB (ref 0.35–4.50)

## 2014-01-11 MED ORDER — LEVOTHYROXINE SODIUM 175 MCG PO TABS: 175.0000 ug | ORAL_TABLET | Freq: Every day | ORAL | Status: DC

## 2014-01-11 NOTE — Progress Notes (Signed)
   Subjective:    Patient ID: Kathy Livingston, female    DOB: [birth year], 43 y.o.   MRN: [id number]  HPI In mid-2013, pt had thyroidectomy for rapid growth of a nodule.  Pathology showed: PAPILLARY THYROID CARCINOMA, COLUMNAR CELL VARIANT, 0.6 CM, CONFINED WITHIN THYROID PARENCHYMA.  Also there were MULTIPLE FOLLICULAR ADENOMAS AND BACKGROUND LYMPHOCYTIC THYROIDITIS.  She was started on synthroid postoperatively.  She feels well on 150 mcg/day.   Past Medical History  Diagnosis Date  . Seasonal allergies     PT IS HOARSE--ATTRIBUTES TO HER ALLERGIES  . PONV (postoperative nausea and vomiting)     N&V FOR 3 HOURS AFTER EPIDURAL FOR C-SECTION.  Marland Kitchen Thyroid cancer 12/2011    Papillary, 0.6cm, T1a    Past Surgical History  Procedure Laterality Date  . Cesarean section  09/22/00, 01/11/04  . Biopsy thyroid  04/16/11  . Thyroidectomy  12/16/2011    Procedure: THYROIDECTOMY;  Surgeon: Gayland Curry, MD,FACS;  Location: [facility];  Service: General;  Laterality: N/A;    History   Social History  . Marital Status: Single    Spouse Name: N/A    Number of Children: 2  . Years of Education: 36   Occupational History  . Teacher    Social History Main Topics  . Smoking status: Never Smoker   . Smokeless tobacco: Never Used  . Alcohol Use: Yes     Comment: OCCAS  . Drug Use: No  . Sexual Activity:    Other Topics Concern  . Not on file   Social History Narrative   Regular exercise-yes   Caffeine Use-yes    Current Outpatient Prescriptions on File Prior to Visit  Medication Sig Dispense Refill  . acidophilus (RISAQUAD) CAPS Take 2 capsules by mouth daily.      . Pseudoephedrine HCl (SUDAFED PO) Take 30 mg by mouth as needed. ONCE A DAY IF NEEDED FOR ALLERGIES      . vitamin E 400 UNIT capsule Take 400 Units by mouth daily.       No current facility-administered medications on file prior to visit.    No Known Allergies  Family History  Problem Relation Age of Onset  . Cancer  Father     lymphoma  . Heart disease Paternal Grandmother   . Cancer Paternal Grandfather     pancreatic and esophageal  . Heart disease Paternal Grandfather   . Alcohol abuse Daughter   . Cancer Other     Breast Cancer-Grandparent  . Hypertension Other     Parent  . Thyroid disease Neg Hx     BP 120/82  Pulse 64  Temp(Src) 97.9 F (36.6 C) (Oral)  Ht 5\' 4"  (1.626 m)  Wt 237 lb (107.502 kg)  BMI 40.66 kg/m2  SpO2 98%   Review of Systems Denies neck pain    Objective:   Physical Exam VITAL SIGNS:  See vs page GENERAL: no distress Neck: a healed scar is present.  i do not appreciate a nodule in the thyroid or elsewhere in the neck Lab Results  Component Value Date   TSH 10.95* 01/11/2014      Assessment & Plan:  Small focus of thyroid cancer, no further rx needed Postsurgical hypothyroidism, moderate exacerbation.  Patient is advised the following: Patient Instructions  blood tests are being requested for you today.  We'll contact you with results. Please return in 1 year.

## 2014-01-11 NOTE — Patient Instructions (Addendum)
blood tests are being requested for you today.  We'll contact you with results.   Please return in 1 year.   

## 2014-11-30 ENCOUNTER — Other Ambulatory Visit: Payer: Self-pay | Admitting: Obstetrics and Gynecology

## 2014-12-01 LAB — CYTOLOGY - PAP

## 2015-01-12 ENCOUNTER — Ambulatory Visit (INDEPENDENT_AMBULATORY_CARE_PROVIDER_SITE_OTHER): Payer: BC Managed Care – PPO | Admitting: Endocrinology

## 2015-01-12 ENCOUNTER — Encounter: Payer: Self-pay | Admitting: Endocrinology

## 2015-01-12 VITALS — BP 128/78 | HR 66 | Temp 98.7°F | Resp 15 | Wt 235.0 lb

## 2015-01-12 DIAGNOSIS — C73 Malignant neoplasm of thyroid gland: Secondary | ICD-10-CM

## 2015-01-12 DIAGNOSIS — E890 Postprocedural hypothyroidism: Secondary | ICD-10-CM

## 2015-01-12 LAB — TSH: TSH: 4.39 u[IU]/mL (ref 0.35–4.50)

## 2015-01-12 LAB — T4, FREE: Free T4: 0.91 ng/dL (ref 0.60–1.60)

## 2015-01-12 NOTE — Progress Notes (Signed)
Subjective:    Patient ID: Kathy Livingston, female    DOB: [birth year], 44 y.o.   MRN: [id number]  HPI Pt returns for f/u of postsurgical hypothyroidism ( in [place], pt had thyroidectomy for rapid growth of a nodule; pathology showed tiny focus of papillary adenocarcinoma; she has been on synthroid since then).  On 175 mcg/day, he feels well except for fatigue.  She says she never misses it.   Past Medical History  Diagnosis Date  . Seasonal allergies     PT IS HOARSE--ATTRIBUTES TO HER ALLERGIES  . PONV (postoperative nausea and vomiting)     N&V FOR 3 HOURS AFTER EPIDURAL FOR C-SECTION.  Marland Kitchen Thyroid cancer 12/2011    Papillary, 0.6cm, T1a    Past Surgical History  Procedure Laterality Date  . Cesarean section  09/22/00, 01/11/04  . Biopsy thyroid  04/16/11  . Thyroidectomy  12/16/2011    Procedure: THYROIDECTOMY;  Surgeon: Gayland Curry, MD,FACS;  Location: [facility];  Service: General;  Laterality: N/A;    History   Social History  . Marital Status: Single    Spouse Name: N/A  . Number of Children: 2  . Years of Education: 19   Occupational History  . Teacher    Social History Main Topics  . Smoking status: Never Smoker   . Smokeless tobacco: Never Used  . Alcohol Use: Yes     Comment: OCCAS  . Drug Use: No  . Sexual Activity: Not on file   Other Topics Concern  . Not on file   Social History Narrative   Regular exercise-yes   Caffeine Use-yes    Current Outpatient Prescriptions on File Prior to Visit  Medication Sig Dispense Refill  . levothyroxine (SYNTHROID, LEVOTHROID) 175 MCG tablet Take 1 tablet (175 mcg total) by mouth daily before breakfast. 90 tablet 3  . acidophilus (RISAQUAD) CAPS Take 2 capsules by mouth daily.    . Pseudoephedrine HCl (SUDAFED PO) Take 30 mg by mouth as needed. ONCE A DAY IF NEEDED FOR ALLERGIES    . vitamin E 400 UNIT capsule Take 400 Units by mouth daily.     No current facility-administered medications on file prior to visit.     No Known Allergies  Family History  Problem Relation Age of Onset  . Cancer Father     lymphoma  . Heart disease Paternal Grandmother   . Cancer Paternal Grandfather     pancreatic and esophageal  . Heart disease Paternal Grandfather   . Alcohol abuse Daughter   . Cancer Other     Breast Cancer-Grandparent  . Hypertension Other     Parent  . Thyroid disease Neg Hx     BP 128/78 mmHg  Pulse 66  Temp(Src) 98.7 F (37.1 C) (Oral)  Resp 15  Wt 235 lb (106.595 kg)  SpO2 98%  Review of Systems Denies weight change.    Objective:   Physical Exam VITAL SIGNS:  See vs page GENERAL: no distress Neck: a healed scar is present.  i do not appreciate a nodule in the thyroid or elsewhere in the neck.     Lab Results  Component Value Date   TSH 4.39 01/12/2015      Assessment & Plan:  Postsurgical hypothyroidism, well-replaced  Patient is advised the following: Patient Instructions  blood tests are requested for you today.  We'll let you know about the results. I would be happy to see you back here whenever you want.  addendum: please continue the same synthroid.

## 2015-01-12 NOTE — Patient Instructions (Signed)
blood tests are requested for you today.  We'll let you know about the results. I would be happy to see you back here whenever you want.

## 2015-02-02 ENCOUNTER — Other Ambulatory Visit: Payer: Self-pay | Admitting: Endocrinology

## 2015-02-15 ENCOUNTER — Telehealth: Payer: Self-pay | Admitting: Endocrinology

## 2015-02-15 MED ORDER — LEVOTHYROXINE SODIUM 175 MCG PO TABS
ORAL_TABLET | ORAL | Status: DC
Start: 2015-02-15 — End: 2015-03-19

## 2015-02-15 NOTE — Telephone Encounter (Signed)
Rx was sent on 02/02/2015 with a 90 day supply and 1 refill. Rx resubmitted to [facility] in [place].

## 2015-02-15 NOTE — Telephone Encounter (Signed)
Synthroid needs to be called into [facility] in [place] please been out for a week please do asap

## 2015-03-14 ENCOUNTER — Telehealth: Payer: Self-pay | Admitting: Endocrinology

## 2015-03-14 DIAGNOSIS — E890 Postprocedural hypothyroidism: Secondary | ICD-10-CM

## 2015-03-14 NOTE — Telephone Encounter (Signed)
Your last blood test was normal, but I am happy to request a recheck.  Please let me know.

## 2015-03-14 NOTE — Telephone Encounter (Signed)
See note below and please advise, Thanks! 

## 2015-03-14 NOTE — Telephone Encounter (Signed)
Pt call wanting to know if her thyroid medicine could be changed she feels tired and irratablae

## 2015-03-15 NOTE — Telephone Encounter (Signed)
I contacted the pt and advised of note below. Pt would like to have her blood test checked. Pt is going to go to [facility].

## 2015-03-15 NOTE — Telephone Encounter (Signed)
Ok, i ordered 

## 2015-03-15 NOTE — Addendum Note (Signed)
Addended by: Renato Shin on: 03/15/2015 12:22 PM   Modules accepted: Orders

## 2015-03-16 ENCOUNTER — Other Ambulatory Visit (INDEPENDENT_AMBULATORY_CARE_PROVIDER_SITE_OTHER): Payer: BC Managed Care – PPO

## 2015-03-16 DIAGNOSIS — E890 Postprocedural hypothyroidism: Secondary | ICD-10-CM

## 2015-03-19 ENCOUNTER — Telehealth: Payer: Self-pay | Admitting: Endocrinology

## 2015-03-19 LAB — TSH: TSH: 3.1 u[IU]/mL (ref 0.35–4.50)

## 2015-03-19 LAB — T4, FREE: FREE T4: 0.96 ng/dL (ref 0.60–1.60)

## 2015-03-19 MED ORDER — LEVOTHYROXINE SODIUM 175 MCG PO TABS: 175.0000 ug | ORAL_TABLET | Freq: Every day | ORAL | Status: DC

## 2015-03-19 NOTE — Telephone Encounter (Signed)
Patient called and would like to know what her test levels were Also, she would like to switch from levothyroxine to straight synthroid not generic    Thank you

## 2015-03-19 NOTE — Telephone Encounter (Signed)
I contacted the patient and advised of her recent blood test. Pt wanted to know if we could send brand synthroid to see if it helps with her symptoms.  Please advise, Thanks!

## 2015-03-19 NOTE — Telephone Encounter (Signed)
Ok, i have sent a prescription to your pharmacy  

## 2015-03-20 NOTE — Telephone Encounter (Signed)
I contacted the pt and left a voicemail advising the medication has been changed.

## 2015-03-22 ENCOUNTER — Telehealth: Payer: Self-pay | Admitting: Endocrinology

## 2015-03-22 NOTE — Telephone Encounter (Signed)
Patient stated that pharmacy haven't received the request for synthroid, please advise

## 2015-03-23 MED ORDER — SYNTHROID 175 MCG PO TABS: 175.0000 ug | ORAL_TABLET | Freq: Every day | ORAL | Status: DC

## 2015-03-23 NOTE — Telephone Encounter (Signed)
Rx resubmitted to [facility].

## 2016-04-22 ENCOUNTER — Telehealth: Payer: Self-pay | Admitting: Endocrinology

## 2016-04-22 MED ORDER — SYNTHROID 175 MCG PO TABS: 175.0000 ug | ORAL_TABLET | Freq: Every day | ORAL | 0 refills | Status: DC

## 2016-04-22 NOTE — Telephone Encounter (Signed)
Pt needs her Synthroid refilled and sent to the [facility] in [place], [place]

## 2016-04-22 NOTE — Telephone Encounter (Signed)
Refill submitted. 

## 2016-05-06 ENCOUNTER — Ambulatory Visit (INDEPENDENT_AMBULATORY_CARE_PROVIDER_SITE_OTHER): Payer: BC Managed Care – PPO | Admitting: Endocrinology

## 2016-05-06 ENCOUNTER — Encounter: Payer: Self-pay | Admitting: Endocrinology

## 2016-05-06 VITALS — BP 118/70 | Ht 64.0 in | Wt 250.0 lb

## 2016-05-06 DIAGNOSIS — C73 Malignant neoplasm of thyroid gland: Secondary | ICD-10-CM | POA: Diagnosis not present

## 2016-05-06 NOTE — Patient Instructions (Addendum)
If you have not heard from [place] about the thyroid blood test result in 3 days, please call us. Let's recheck the ultrasound.  you will receive a phone call, about a day and time for an appointment.   Please return in 1 year.

## 2016-05-06 NOTE — Progress Notes (Signed)
Subjective:    Patient ID: Kathy Livingston, female    DOB: [birth year], 45 y.o.   MRN: [id number]  HPI Pt returns for f/u of postsurgical hypothyroidism ( in [place], pt had thyroidectomy for rapid growth of a nodule; pathology showed 6 mm focus of papillary adenocarcinoma, tall columnar variant, all within the specimen; she has been on synthroid since then).  On 175 mcg/day, he feels well except for slight swelling of the legs.  She says she never misses synthroid.  Past Medical History:  Diagnosis Date  . PONV (postoperative nausea and vomiting)    N&V FOR 3 HOURS AFTER EPIDURAL FOR C-SECTION.  . Seasonal allergies    PT IS HOARSE--ATTRIBUTES TO HER ALLERGIES  . Thyroid cancer ([place]) 12/2011   Papillary, 0.6cm, T1a    Past Surgical History:  Procedure Laterality Date  . BIOPSY THYROID  04/16/11  . CESAREAN SECTION  09/22/00, 01/11/04  . THYROIDECTOMY  12/16/2011   Procedure: THYROIDECTOMY;  Surgeon: Gayland Curry, MD,FACS;  Location: [facility];  Service: General;  Laterality: N/A;    Social History   Social History  . Marital status: Single    Spouse name: N/A  . Number of children: 2  . 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 Smoking status: Never Smoker  . Smokeless tobacco: Never Used  . Alcohol use Yes     Comment: OCCAS  . Drug use: No  . Sexual activity: Not on file   Other Topics Concern  . Not on file   Social History Narrative   Regular exercise-yes   Caffeine Use-yes    Current Outpatient Prescriptions on File Prior to Visit  Medication Sig Dispense Refill  . acidophilus (RISAQUAD) CAPS Take 2 capsules by mouth daily.    . Pseudoephedrine HCl (SUDAFED PO) Take 30 mg by mouth as needed. ONCE A DAY IF NEEDED FOR ALLERGIES    . SYNTHROID 175 MCG tablet Take 1 tablet (175 mcg total) by mouth daily before breakfast. 30 tablet 0  . vitamin E 400 UNIT capsule Take 400 Units by mouth daily.      No current facility-administered medications on file prior to visit.     No Known Allergies  Family History  Problem Relation Age of Onset  . Cancer Father     lymphoma  . Heart disease Paternal Grandmother   . Cancer Paternal Grandfather     pancreatic and esophageal  . Heart disease Paternal Grandfather   . Alcohol abuse Daughter   . Cancer Other     Breast Cancer-Grandparent  . Hypertension Other     Parent  . Thyroid disease Neg Hx     BP 118/70   Ht 5\' 4"  (1.626 m)   Wt 250 lb (113.4 kg)   BMI 42.91 kg/m    Review of Systems Denies depression.     Objective:   Physical Exam VITAL SIGNS:  See vs page GENERAL: no distress Neck: a healed scar is present.  i do not appreciate a nodule in the thyroid or elsewhere in the neck     Assessment & Plan:  Hypothyroidism, due for recheck.  Nodular thyroid, due for recheck.  Patient is advised the following: Patient Instructions  If you have not heard from [place] about the thyroid blood test result in 3 days, please call us. Let's recheck the ultrasound.  you will receive a phone call, about a  day and time for an appointment.   Please return in 1 year.

## 2016-05-09 ENCOUNTER — Telehealth: Payer: Self-pay | Admitting: Endocrinology

## 2016-05-09 ENCOUNTER — Encounter: Payer: Self-pay | Admitting: Endocrinology

## 2016-05-09 MED ORDER — LEVOTHYROXINE SODIUM 200 MCG PO TABS: 200.0000 ug | ORAL_TABLET | Freq: Every day | ORAL | 3 refills | Status: DC

## 2016-05-09 NOTE — Telephone Encounter (Signed)
See message and please advise, Thanks!  

## 2016-05-09 NOTE — Telephone Encounter (Signed)
Patient stated that her OBGYN said her her thyroid level is high, please let her know if you are going to adjust her medication. Please advise

## 2016-05-09 NOTE — Telephone Encounter (Signed)
I need results from OB/GYN, thanks

## 2016-05-09 NOTE — Telephone Encounter (Signed)
I contacted the patient and advised of message. Patient voiced understanding and had no further questions.  

## 2016-05-09 NOTE — Telephone Encounter (Signed)
Ok, I have sent a prescription to your pharmacy, to increase to 200 mcg/d.  Please come in to the lab, to recheck in 1 month.

## 2016-05-09 NOTE — Telephone Encounter (Addendum)
I received the paper records from [facility] and the reccords state the TSH level was 14.42. Could you review and please advise, Thanks!

## 2016-05-12 ENCOUNTER — Telehealth: Payer: Self-pay | Admitting: Endocrinology

## 2016-06-03 MED ORDER — LEVOTHYROXINE SODIUM 200 MCG PO TABS: 200.0000 ug | ORAL_TABLET | Freq: Every day | ORAL | 3 refills | Status: DC

## 2016-06-03 NOTE — Telephone Encounter (Signed)
Pt needs synthyroid called in please to the [facility] in [place]

## 2016-06-03 NOTE — Telephone Encounter (Signed)
Refill submitted. 

## 2016-06-17 ENCOUNTER — Other Ambulatory Visit (INDEPENDENT_AMBULATORY_CARE_PROVIDER_SITE_OTHER): Payer: BC Managed Care – PPO

## 2016-06-17 ENCOUNTER — Other Ambulatory Visit: Payer: Self-pay | Admitting: Endocrinology

## 2016-06-17 ENCOUNTER — Ambulatory Visit
Admission: RE | Admit: 2016-06-17 | Discharge: 2016-06-17 | Disposition: A | Discharge status: Home / Self Care | Payer: BC Managed Care – PPO | Source: Ambulatory Visit | Attending: Endocrinology | Admitting: Endocrinology

## 2016-06-17 DIAGNOSIS — E890 Postprocedural hypothyroidism: Secondary | ICD-10-CM

## 2016-06-17 DIAGNOSIS — C73 Malignant neoplasm of thyroid gland: Secondary | ICD-10-CM

## 2016-06-17 LAB — TSH: TSH: 1.8 u[IU]/mL (ref 0.35–4.50)

## 2017-01-27 ENCOUNTER — Other Ambulatory Visit: Payer: Self-pay | Admitting: Endocrinology

## 2017-05-06 ENCOUNTER — Ambulatory Visit: Payer: BC Managed Care – PPO | Admitting: Endocrinology

## 2017-05-06 DIAGNOSIS — Z0289 Encounter for other administrative examinations: Secondary | ICD-10-CM

## 2017-06-15 ENCOUNTER — Other Ambulatory Visit: Payer: Self-pay | Admitting: Endocrinology

## 2017-07-16 ENCOUNTER — Ambulatory Visit: Payer: BC Managed Care – PPO | Admitting: Endocrinology

## 2017-07-16 ENCOUNTER — Encounter: Payer: Self-pay | Admitting: Endocrinology

## 2017-07-16 VITALS — BP 142/96 | HR 70 | Wt 251.0 lb

## 2017-07-16 DIAGNOSIS — E890 Postprocedural hypothyroidism: Secondary | ICD-10-CM

## 2017-07-16 LAB — T4, FREE: Free T4: 0.89 ng/dL (ref 0.60–1.60)

## 2017-07-16 LAB — TSH: TSH: 28.74 u[IU]/mL — AB (ref 0.35–4.50)

## 2017-07-16 MED ORDER — LEVOTHYROXINE SODIUM 125 MCG PO TABS: 250.0000 ug | ORAL_TABLET | Freq: Every day | ORAL | 3 refills | Status: DC

## 2017-07-16 NOTE — Progress Notes (Signed)
Subjective:    Patient ID: Kathy Livingston, female    DOB: [birth year], 47 y.o.   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  She says she never misses synthroid.  Past Medical History:  Diagnosis Date  . PONV (postoperative nausea and vomiting)    N&V FOR 3 HOURS AFTER EPIDURAL FOR C-SECTION.  . Seasonal allergies    PT IS HOARSE--ATTRIBUTES TO HER ALLERGIES  . 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 BIOPSY THYROID  04/16/11  . CESAREAN SECTION  09/22/00, 01/11/04  . THYROIDECTOMY  12/16/2011   Procedure: THYROIDECTOMY;  Surgeon: Gayland Curry, MD,FACS;  Location: [facility];  Service: General;  Laterality: N/A;    Social History   Socioeconomic History  . Marital status: Single    Spouse name: Not on file  . Number of children: 2  . Years of education: 38  . Highest education level: Not on file  Social Needs  . Financial resource strain: Not on file  . Food insecurity - worry: Not on file  . Food insecurity - inability: Not on file  . Transportation needs - medical: Not on file  . Transportation needs - non-medical: Not on file  Occupational History  . Occupation: Product manager: [employer]  Tobacco Use  . Smoking status: Never Smoker  . Smokeless tobacco: Never Used  Substance and Sexual Activity  . Alcohol use: Yes    Comment: OCCAS  . Drug use: No  . Sexual activity: Not on file  Other Topics Concern  . Not on file  Social History Narrative   Regular exercise-yes   Caffeine Use-yes    Current Outpatient Medications on File Prior to Visit  Medication Sig Dispense Refill  . acidophilus  (RISAQUAD) CAPS Take 2 capsules by mouth daily.    . Pseudoephedrine HCl (SUDAFED PO) Take 30 mg by mouth as needed. ONCE A DAY IF NEEDED FOR ALLERGIES    . vitamin E 400 UNIT capsule Take 400 Units by mouth daily.     No current facility-administered medications on file prior to visit.     No Known Allergies  Family History  Problem Relation Age of Onset  . Cancer Father        lymphoma  . Heart disease Paternal Grandmother   . Cancer Paternal Grandfather        pancreatic and esophageal  . Heart disease Paternal Grandfather   . Alcohol abuse Daughter   . Cancer Other        Breast Cancer-Grandparent  . Hypertension Other        Parent  . Thyroid disease Neg Hx     BP (!) 142/96 (BP Location: Left Arm, Cuff Size: Large)   Pulse 70   Wt 251 lb (113.9 kg)   SpO2 97%   BMI 43.08 kg/m    Review of Systems Denies leg edema.    Objective:   Physical Exam VITAL SIGNS:  See vs page GENERAL: no distress Neck: a healed scar is present.  I do not appreciate a nodule in the thyroid or elsewhere in the neck.  Assessment & Plan:  Hypothyroidism: due for recheck  Patient Instructions  blood tests are requested for you today.  We'll let you know about the results. I would be happy to see you back here as needed

## 2017-07-16 NOTE — Patient Instructions (Addendum)
blood tests are requested for you today.  We'll let you know about the results. I would be happy to see you back here as needed.   

## 2017-07-21 ENCOUNTER — Other Ambulatory Visit: Payer: Self-pay

## 2017-07-21 ENCOUNTER — Telehealth: Payer: Self-pay | Admitting: Endocrinology

## 2017-07-21 MED ORDER — LEVOTHYROXINE SODIUM 125 MCG PO TABS: 250.0000 ug | ORAL_TABLET | Freq: Every day | ORAL | 3 refills | Status: DC

## 2017-07-21 NOTE — Telephone Encounter (Signed)
Patient needs thyroid med script sent to [facility] asap. Pharmacy told patient they have not received script yet

## 2017-07-21 NOTE — Telephone Encounter (Signed)
I have resent prescription

## 2017-09-01 ENCOUNTER — Other Ambulatory Visit (INDEPENDENT_AMBULATORY_CARE_PROVIDER_SITE_OTHER): Payer: BC Managed Care – PPO

## 2017-09-01 ENCOUNTER — Other Ambulatory Visit: Payer: Self-pay | Admitting: Endocrinology

## 2017-09-01 DIAGNOSIS — E890 Postprocedural hypothyroidism: Secondary | ICD-10-CM

## 2017-09-01 LAB — TSH: TSH: 8.83 u[IU]/mL — ABNORMAL HIGH (ref 0.35–4.50)

## 2017-09-01 MED ORDER — LEVOTHYROXINE SODIUM 137 MCG PO TABS: 274.0000 ug | ORAL_TABLET | Freq: Every day | ORAL | 11 refills | Status: DC

## 2017-09-04 ENCOUNTER — Telehealth: Payer: Self-pay | Admitting: Endocrinology

## 2017-09-04 NOTE — Telephone Encounter (Signed)
Patient stated the pharmacy haven't received her prescription,synthroid, she stated synthroid medication is supposed to be increased to 137 mg.  Please advise

## 2017-09-04 NOTE — Telephone Encounter (Signed)
Pt informed of below. She states she no longer uses [facility]. She mainly uses [facility] in [place].

## 2017-09-04 NOTE — Telephone Encounter (Signed)
LVM that this was sent on 2/26 to [facility], call back to clarify pharmacy

## 2017-11-09 ENCOUNTER — Other Ambulatory Visit: Payer: BC Managed Care – PPO

## 2017-11-10 ENCOUNTER — Other Ambulatory Visit: Payer: Self-pay | Admitting: Endocrinology

## 2017-11-10 ENCOUNTER — Other Ambulatory Visit (INDEPENDENT_AMBULATORY_CARE_PROVIDER_SITE_OTHER): Payer: BC Managed Care – PPO

## 2017-11-10 DIAGNOSIS — E890 Postprocedural hypothyroidism: Secondary | ICD-10-CM

## 2017-11-10 LAB — TSH: TSH: 0.03 u[IU]/mL — ABNORMAL LOW (ref 0.35–4.50)

## 2017-12-16 ENCOUNTER — Other Ambulatory Visit: Payer: BC Managed Care – PPO

## 2017-12-17 ENCOUNTER — Other Ambulatory Visit (INDEPENDENT_AMBULATORY_CARE_PROVIDER_SITE_OTHER): Payer: BC Managed Care – PPO

## 2017-12-17 DIAGNOSIS — E890 Postprocedural hypothyroidism: Secondary | ICD-10-CM

## 2017-12-17 LAB — T4, FREE: FREE T4: 1.83 ng/dL — AB (ref 0.60–1.60)

## 2017-12-17 LAB — TSH: TSH: 0.01 u[IU]/mL — AB (ref 0.35–4.50)

## 2017-12-21 ENCOUNTER — Telehealth: Payer: Self-pay | Admitting: Endocrinology

## 2017-12-21 NOTE — Telephone Encounter (Signed)
Patient would like a call back with her lab results.

## 2017-12-21 NOTE — Telephone Encounter (Signed)
LVM that labs had not yet been released & when they were I would call to let her know results.

## 2017-12-22 ENCOUNTER — Other Ambulatory Visit: Payer: Self-pay | Admitting: Endocrinology

## 2017-12-22 DIAGNOSIS — E890 Postprocedural hypothyroidism: Secondary | ICD-10-CM

## 2017-12-22 MED ORDER — LEVOTHYROXINE SODIUM 200 MCG PO TABS: 200.0000 ug | ORAL_TABLET | Freq: Every day | ORAL | 2 refills | Status: DC

## 2018-01-05 ENCOUNTER — Other Ambulatory Visit: Payer: Self-pay | Admitting: Endocrinology

## 2018-01-05 ENCOUNTER — Other Ambulatory Visit (INDEPENDENT_AMBULATORY_CARE_PROVIDER_SITE_OTHER): Payer: BC Managed Care – PPO

## 2018-01-05 DIAGNOSIS — E890 Postprocedural hypothyroidism: Secondary | ICD-10-CM

## 2018-01-05 LAB — TSH: TSH: 0.03 u[IU]/mL — AB (ref 0.35–4.50)

## 2018-01-05 LAB — T4, FREE: Free T4: 1.09 ng/dL (ref 0.60–1.60)

## 2018-01-05 MED ORDER — LEVOTHYROXINE SODIUM 175 MCG PO TABS: 175.0000 ug | ORAL_TABLET | Freq: Every day | ORAL | 2 refills | Status: DC

## 2018-02-08 ENCOUNTER — Other Ambulatory Visit (INDEPENDENT_AMBULATORY_CARE_PROVIDER_SITE_OTHER): Payer: BC Managed Care – PPO

## 2018-02-08 DIAGNOSIS — E890 Postprocedural hypothyroidism: Secondary | ICD-10-CM | POA: Diagnosis not present

## 2018-02-08 LAB — T4, FREE: Free T4: 0.89 ng/dL (ref 0.60–1.60)

## 2018-02-08 LAB — TSH: TSH: 0.44 u[IU]/mL (ref 0.35–4.50)

## 2018-04-01 ENCOUNTER — Other Ambulatory Visit: Payer: Self-pay | Admitting: Endocrinology

## 2018-04-01 ENCOUNTER — Telehealth: Payer: Self-pay | Admitting: Endocrinology

## 2018-04-01 NOTE — Telephone Encounter (Signed)
Please refill x 1 Ov is due  

## 2018-04-01 NOTE — Telephone Encounter (Signed)
Is this okay to refill? Canceled last two appointments

## 2018-06-26 ENCOUNTER — Other Ambulatory Visit: Payer: Self-pay | Admitting: Endocrinology

## 2018-09-05 ENCOUNTER — Other Ambulatory Visit: Payer: Self-pay | Admitting: Endocrinology

## 2018-09-05 NOTE — Telephone Encounter (Signed)
Options: Please refill x 1, and ov is due Please forward refill request to pt's primary care provider.

## 2018-12-01 ENCOUNTER — Other Ambulatory Visit: Payer: Self-pay | Admitting: Endocrinology

## 2018-12-02 ENCOUNTER — Encounter: Payer: Self-pay | Admitting: Endocrinology

## 2018-12-02 NOTE — Telephone Encounter (Signed)
Please refill x 1 F/u is due Last refill until ov

## 2018-12-03 ENCOUNTER — Other Ambulatory Visit: Payer: Self-pay

## 2018-12-03 ENCOUNTER — Ambulatory Visit (INDEPENDENT_AMBULATORY_CARE_PROVIDER_SITE_OTHER): Payer: BC Managed Care – PPO | Admitting: Endocrinology

## 2018-12-03 DIAGNOSIS — E890 Postprocedural hypothyroidism: Secondary | ICD-10-CM | POA: Diagnosis not present

## 2018-12-03 NOTE — Patient Instructions (Addendum)
blood tests are requested for you today.  We'll let you know about the results. I would be happy to see you back here as needed.   

## 2018-12-03 NOTE — Progress Notes (Signed)
Subjective:    Patient ID: Kathy Livingston, female    DOB: 12/13/1970, 48 y.o.   MRN: [id number]  HPI telehealth visit today via doxy video visit.  Alternatives to telehealth are presented to this patient, and the patient agrees to the telehealth visit. Pt is advised of the cost of the visit, and agrees to this, also.   Patient is in her car, and I am at the office.   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  She says she never misses synthroid.  pt states she feels well in general.   Past Medical History:  Diagnosis Date  . PONV (postoperative nausea and vomiting)    N&V FOR 3 HOURS AFTER EPIDURAL FOR C-SECTION.  . Seasonal allergies    PT IS HOARSE--ATTRIBUTES TO HER ALLERGIES  . 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 BIOPSY THYROID  04/16/11  . CESAREAN SECTION  09/22/00, 01/11/04  . THYROIDECTOMY  12/16/2011   Procedure: THYROIDECTOMY;  Surgeon: Gayland Curry, MD,FACS;  Location: [facility];  Service: General;  Laterality: N/A;    Social History   Socioeconomic History  . Marital status: Single    Spouse name: Not on file  . Number of children: 2  . Years of education: 68  . Highest education level: Not on file  Occupational History  . Occupation: Product manager: [employer]  Social Needs  . Financial resource strain: Not on file  . Food insecurity:    Worry: Not on file    Inability: Not on file  . Transportation needs:    Medical: Not on file    Non-medical: Not on file  Tobacco Use  . Smoking status: Never Smoker  . Smokeless tobacco: Never Used  Substance  and Sexual Activity  . Alcohol use: Yes    Comment: OCCAS  . Drug use: No  . Sexual activity: Not on file  Lifestyle  . Physical activity:    Days per week: Not on file    Minutes per session: Not on file  . Stress: Not on file  Relationships  . Social connections:    Talks on phone: Not on file    Gets together: Not on file    Attends religious service: Not on file    Active member of club or organization: Not on file    Attends meetings of clubs or organizations: Not on file    Relationship status: Not on file  . Intimate partner violence:    Fear of current or ex partner: Not on file    Emotionally abused: Not on file    Physically abused: Not on file    Forced sexual activity: Not on file  Other Topics Concern  . Not on file  Social History Narrative   Regular exercise-yes   Caffeine Use-yes    Current Outpatient Medications on File Prior to Visit  Medication Sig Dispense Refill  . acidophilus (RISAQUAD) CAPS Take 2 capsules by mouth daily.    Marland Kitchen escitalopram (LEXAPRO) 10 MG tablet Take 10 mg by mouth daily.    Marland Kitchen gabapentin (  NEURONTIN) 300 MG capsule Take 300 mg by mouth every evening.    Marland Kitchen levothyroxine (SYNTHROID, LEVOTHROID) 175 MCG tablet TAKE 1 TABLET (175 MCG TOTAL) BY MOUTH DAILY BEFORE BREAKFAST. 90 tablet 0  . Pseudoephedrine HCl (SUDAFED PO) Take 30 mg by mouth as needed. ONCE A DAY IF NEEDED FOR ALLERGIES    . vitamin E 400 UNIT capsule Take 400 Units by mouth daily.     No current facility-administered medications on file prior to visit.     No Known Allergies  Family History  Problem Relation Age of Onset  . Cancer Father        lymphoma  . Heart disease Paternal Grandmother   . Cancer Paternal Grandfather        pancreatic and esophageal  . Heart disease Paternal Grandfather   . Alcohol abuse Daughter   . Cancer Other        Breast Cancer-Grandparent  . Hypertension Other        Parent  . Thyroid disease Neg Hx      Review of Systems  Denies neck pain or swelling.      Objective:   Physical Exam      Assessment & Plan:  Hypothyroidism: due for recheck PTC, small focus: all she needs is annual PE of the neck  Patient Instructions  blood tests are requested for you today.  We'll let you know about the results.   I would be happy to see you back here as needed.

## 2018-12-07 ENCOUNTER — Other Ambulatory Visit: Payer: Self-pay

## 2018-12-07 ENCOUNTER — Other Ambulatory Visit (INDEPENDENT_AMBULATORY_CARE_PROVIDER_SITE_OTHER): Payer: BC Managed Care – PPO

## 2018-12-07 ENCOUNTER — Other Ambulatory Visit: Payer: Self-pay | Admitting: Endocrinology

## 2018-12-07 DIAGNOSIS — E890 Postprocedural hypothyroidism: Secondary | ICD-10-CM

## 2018-12-07 LAB — TSH: TSH: 6.23 u[IU]/mL — ABNORMAL HIGH (ref 0.35–4.50)

## 2018-12-07 LAB — T4, FREE: Free T4: 0.95 ng/dL (ref 0.60–1.60)

## 2018-12-07 MED ORDER — LEVOTHYROXINE SODIUM 200 MCG PO TABS: 200.0000 ug | ORAL_TABLET | Freq: Every day | ORAL | 3 refills | Status: DC

## 2019-12-12 ENCOUNTER — Other Ambulatory Visit: Payer: Self-pay | Admitting: Endocrinology

## 2019-12-14 ENCOUNTER — Other Ambulatory Visit: Payer: Self-pay | Admitting: Endocrinology

## 2020-01-25 ENCOUNTER — Ambulatory Visit: Payer: BC Managed Care – PPO | Admitting: Endocrinology

## 2020-01-25 ENCOUNTER — Other Ambulatory Visit: Payer: Self-pay

## 2020-01-25 ENCOUNTER — Encounter: Payer: Self-pay | Admitting: Endocrinology

## 2020-01-25 VITALS — BP 120/80 | HR 71 | Ht 64.0 in | Wt 256.6 lb

## 2020-01-25 DIAGNOSIS — R252 Cramp and spasm: Secondary | ICD-10-CM

## 2020-01-25 DIAGNOSIS — E890 Postprocedural hypothyroidism: Secondary | ICD-10-CM

## 2020-01-25 LAB — VITAMIN D 25 HYDROXY (VIT D DEFICIENCY, FRACTURES): VITD: 29.16 ng/mL — ABNORMAL LOW (ref 30.00–100.00)

## 2020-01-25 LAB — T4, FREE: Free T4: 1.1 ng/dL (ref 0.60–1.60)

## 2020-01-25 LAB — TSH: TSH: 0.31 u[IU]/mL — ABNORMAL LOW (ref 0.35–4.50)

## 2020-01-25 NOTE — Patient Instructions (Addendum)
blood tests are requested for you today.  We'll let you know about the results. Please come back for a follow-up appointment in 1 year.   

## 2020-01-25 NOTE — Progress Notes (Signed)
Subjective:    Patient ID: Kathy Livingston, female    DOB: 11-17-1970, 49 y.o.   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  She says she never misses synthroid. pt states she feels well in general, except for leg cramps.  She takes a multivitamin-D, but not a dedicated vit-D supplement.    Past Medical History:  Diagnosis Date  . PONV (postoperative nausea and vomiting)    N&V FOR 3 HOURS AFTER EPIDURAL FOR C-SECTION.  . Seasonal allergies    PT IS HOARSE--ATTRIBUTES TO HER ALLERGIES  . 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 BIOPSY THYROID  04/16/11  . CESAREAN SECTION  09/22/00, 01/11/04  . THYROIDECTOMY  12/16/2011   Procedure: THYROIDECTOMY;  Surgeon: Gayland Curry, MD,FACS;  Location: [facility];  Service: General;  Laterality: N/A;    Social History   Socioeconomic History  . Marital status: Single    Spouse name: Not on file  . Number of children: 2  . Years of education: 72  . Highest education level: Not on file  Occupational History  . Occupation: Product manager: [employer]  Tobacco Use  . Smoking status: Never Smoker  . Smokeless tobacco: Never Used  Substance and Sexual Activity  . Alcohol use: Yes    Comment: OCCAS  . Drug use: No  . Sexual activity: Not on file  Other Topics Concern  . Not on file  Social History Narrative   Regular exercise-yes   Caffeine Use-yes   Social Determinants of Health   Financial Resource Strain:   . Difficulty of Paying Living Expenses:   Food Insecurity:   . Worried About Charity fundraiser in the Last Year:   . Arboriculturist in the Last Year:     Transportation Needs:   . Film/video editor (Medical):   Marland Kitchen Lack of Transportation (Non-Medical):   Physical Activity:   . Days of Exercise per Week:   . Minutes of Exercise per Session:   Stress:   . Feeling of Stress :   Social Connections:   . Frequency of Communication with Friends and Family:   . Frequency of Social Gatherings with Friends and Family:   . Attends Religious Services:   . Active Member of Clubs or Organizations:   . Attends Archivist Meetings:   Marland Kitchen Marital Status:   Intimate Partner Violence:   . Fear of Current or Ex-Partner:   . Emotionally Abused:   Marland Kitchen Physically Abused:   . Sexually Abused:     Current Outpatient Medications on File Prior to Visit  Medication Sig Dispense Refill  . acidophilus (RISAQUAD) CAPS Take 2 capsules by mouth daily.    Marland Kitchen escitalopram (LEXAPRO) 10 MG tablet Take 10 mg by mouth daily.    Marland Kitchen gabapentin (NEURONTIN) 300 MG capsule Take 300 mg by mouth every evening.    Marland Kitchen levothyroxine (SYNTHROID) 200 MCG tablet TAKE 1 TABLET (200 MCG TOTAL) BY MOUTH DAILY BEFORE BREAKFAST. 90 tablet 0  . Pseudoephedrine HCl (SUDAFED PO) Take 30 mg by mouth as needed. ONCE A DAY IF NEEDED FOR  ALLERGIES    . vitamin E 400 UNIT capsule Take 400 Units by mouth daily.     No current facility-administered medications on file prior to visit.    No Known Allergies  Family History  Problem Relation Age of Onset  . Cancer Father        lymphoma  . Heart disease Paternal Grandmother   . Cancer Paternal Grandfather        pancreatic and esophageal  . Heart disease Paternal Grandfather   . Alcohol abuse Daughter   . Cancer Other        Breast Cancer-Grandparent  . Hypertension Other        Parent  . Thyroid disease Neg Hx     BP 120/80   Pulse 71   Ht 5\' 4"  (1.626 m)   Wt 256 lb 9.6 oz (116.4 kg)   SpO2 97%   BMI 44.05 kg/m    Review of Systems She denies neck swelling and pain.    Objective:   Physical Exam VITAL SIGNS:   See vs page GENERAL: no distress Neck: a healed scar is present.  I do not appreciate a nodule in the thyroid or elsewhere in the neck.    Lab Results  Component Value Date   TSH 0.31 (L) 01/25/2020      Assessment & Plan:  Hyperthyroidism, mild, due to synthroid.  As this is mild, and last TSH was high, I advised continuing the same rx for now.  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   Patient Instructions  blood tests are requested for you today.  We'll let you know about the results.   Please come back for a follow-up appointment in 1 year.

## 2020-01-26 LAB — PTH, INTACT AND CALCIUM
Calcium: 9.4 mg/dL (ref 8.6–10.2)
PTH: 27 pg/mL (ref 14–64)

## 2020-03-09 ENCOUNTER — Other Ambulatory Visit: Payer: Self-pay | Admitting: Endocrinology

## 2020-06-10 ENCOUNTER — Other Ambulatory Visit: Payer: Self-pay | Admitting: Endocrinology

## 2020-09-05 ENCOUNTER — Other Ambulatory Visit: Payer: Self-pay | Admitting: Endocrinology

## 2020-12-08 ENCOUNTER — Other Ambulatory Visit: Payer: Self-pay | Admitting: Endocrinology

## 2021-01-24 ENCOUNTER — Ambulatory Visit: Payer: BC Managed Care – PPO | Admitting: Endocrinology

## 2021-01-31 ENCOUNTER — Ambulatory Visit: Payer: BC Managed Care – PPO | Admitting: Endocrinology

## 2021-02-25 ENCOUNTER — Encounter: Payer: Self-pay | Admitting: Endocrinology

## 2021-02-25 ENCOUNTER — Ambulatory Visit (INDEPENDENT_AMBULATORY_CARE_PROVIDER_SITE_OTHER): Payer: BC Managed Care – PPO | Admitting: Endocrinology

## 2021-02-25 ENCOUNTER — Other Ambulatory Visit: Payer: Self-pay

## 2021-02-25 VITALS — BP 132/88 | HR 72 | Ht 64.0 in | Wt 256.0 lb

## 2021-02-25 DIAGNOSIS — E890 Postprocedural hypothyroidism: Secondary | ICD-10-CM

## 2021-02-25 DIAGNOSIS — E559 Vitamin D deficiency, unspecified: Secondary | ICD-10-CM

## 2021-02-25 LAB — T4, FREE: Free T4: 0.37 ng/dL — ABNORMAL LOW (ref 0.60–1.60)

## 2021-02-25 LAB — TSH: TSH: 89.18 u[IU]/mL — ABNORMAL HIGH (ref 0.35–5.50)

## 2021-02-25 LAB — VITAMIN D 25 HYDROXY (VIT D DEFICIENCY, FRACTURES): VITD: 26.15 ng/mL — ABNORMAL LOW (ref 30.00–100.00)

## 2021-02-25 NOTE — Progress Notes (Signed)
Subjective:    Patient ID: Kathy Livingston, female    DOB: Apr 22, 1971, 50 y.o.   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  She says she never misses synthroid. pt states she feels well in general, except for inability to lose weight.  She still takes a multivitamin-D, but not a dedicated Vit-D supplement.   Past Medical History:  Diagnosis Date   PONV (postoperative nausea and vomiting)    N&V FOR 3 HOURS AFTER EPIDURAL FOR C-SECTION.   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, T1a    Past Surgical History:  Procedure Laterality Date   BIOPSY THYROID  04/16/11   CESAREAN SECTION  09/22/00, 01/11/04   THYROIDECTOMY  12/16/2011   Procedure: THYROIDECTOMY;  Surgeon: Gayland Curry, MD,FACS;  Location: [facility];  Service: General;  Laterality: N/A;    Social History   Socioeconomic History   Marital status: Single    Spouse name: Not on file   Number of children: 2   Years of education: 19   Highest education level: Not on file  Occupational History   Occupation: Product manager: [employer]  Tobacco Use   Smoking status: Never   Smokeless tobacco: Never  Substance and Sexual Activity   Alcohol use: Yes    Comment: OCCAS   Drug use: No   Sexual activity: Not on file  Other Topics Concern   Not on file  Social History Narrative   Regular exercise-yes   Caffeine Use-yes   Social Determinants of Health   Financial Resource Strain: Not on file  Food Insecurity: Not on file  Transportation Needs: Not on file  Physical Activity: Not on file  Stress: Not on file  Social Connections: Not on file   Intimate Partner Violence: Not on file    Current Outpatient Medications on File Prior to Visit  Medication Sig Dispense Refill   acidophilus (RISAQUAD) CAPS Take 2 capsules by mouth daily.     escitalopram (LEXAPRO) 10 MG tablet Take 10 mg by mouth daily.     gabapentin (NEURONTIN) 300 MG capsule Take 300 mg by mouth every evening.     levothyroxine (SYNTHROID) 200 MCG tablet TAKE 1 TABLET (200 MCG TOTAL) BY MOUTH DAILY BEFORE BREAKFAST. 60 tablet 0   Pseudoephedrine HCl (SUDAFED PO) Take 30 mg by mouth as needed. ONCE A DAY IF NEEDED FOR ALLERGIES     vitamin E 400 UNIT capsule Take 400 Units by mouth daily.     No current facility-administered medications on file prior to visit.    No Known Allergies  Family History  Problem Relation Age of Onset   Cancer Father        lymphoma   Heart disease Paternal Grandmother    Cancer Paternal Grandfather        pancreatic and esophageal   Heart disease Paternal Grandfather    Alcohol abuse Daughter    Cancer Other        Breast Cancer-Grandparent   Hypertension Other        Parent   Thyroid disease Neg Hx     BP 132/88  Pulse 72   Ht '5\' 4"'$  (1.626 m)   Wt 256 lb (116.1 kg)   SpO2 97%   BMI 43.94 kg/m    Review of Systems    Objective:   Physical Exam Neck: a healed scar is present.  I do not appreciate a nodule in the thyroid or elsewhere in the neck   Lab Results  Component Value Date   TSH 89.18 (H) 02/25/2021      Assessment & Plan:  Hypothyroidism: uncontrolled, due to noncompliance.  I told pt she can take synthroid at any time of day, to improve compliance. Vit-D def: recheck today

## 2021-02-25 NOTE — Patient Instructions (Signed)
blood tests are requested for you today.  We'll let you know about the results. Please come back for a follow-up appointment in 1 year.   

## 2021-02-26 ENCOUNTER — Telehealth: Payer: Self-pay

## 2021-02-26 NOTE — Telephone Encounter (Signed)
Pt confirmed she had been missing some doses. Pt wanted to know if she could switch from taking her Levothyroxine in the morning to the evening?

## 2021-02-26 NOTE — Telephone Encounter (Signed)
Called and left detailed message for pt to call back to schedule lab appt

## 2021-02-26 NOTE — Telephone Encounter (Signed)
-----   Message from Renato Shin, MD sent at 02/25/2021  4:49 PM EDT ----- please contact patient: Thyroid is really low.  Any idea why this is?  Do you ever miss the medication?  It should be taken on an empty stomach.  Please let us know.

## 2021-04-09 ENCOUNTER — Telehealth: Payer: Self-pay | Admitting: Endocrinology

## 2021-04-09 NOTE — Telephone Encounter (Signed)
Pt calling to get refill of Levothyroxine, [facility], pt contact [phone]

## 2021-04-11 ENCOUNTER — Other Ambulatory Visit: Payer: Self-pay

## 2021-04-11 DIAGNOSIS — E890 Postprocedural hypothyroidism: Secondary | ICD-10-CM

## 2021-04-11 MED ORDER — LEVOTHYROXINE SODIUM 200 MCG PO TABS: 200.0000 ug | ORAL_TABLET | Freq: Every day | ORAL | 3 refills | Status: DC

## 2021-04-12 ENCOUNTER — Other Ambulatory Visit: Payer: Self-pay

## 2021-04-12 ENCOUNTER — Other Ambulatory Visit (INDEPENDENT_AMBULATORY_CARE_PROVIDER_SITE_OTHER): Payer: BC Managed Care – PPO

## 2021-04-12 DIAGNOSIS — E890 Postprocedural hypothyroidism: Secondary | ICD-10-CM

## 2021-04-12 LAB — TSH: TSH: 1.09 u[IU]/mL (ref 0.35–5.50)

## 2021-04-12 LAB — T4, FREE: Free T4: 1.04 ng/dL (ref 0.60–1.60)

## 2021-05-28 ENCOUNTER — Encounter: Payer: Self-pay | Admitting: Gastroenterology

## 2021-07-22 ENCOUNTER — Ambulatory Visit (AMBULATORY_SURGERY_CENTER): Payer: Self-pay

## 2021-07-22 ENCOUNTER — Other Ambulatory Visit: Payer: Self-pay

## 2021-07-22 ENCOUNTER — Encounter: Payer: Self-pay | Admitting: Gastroenterology

## 2021-07-22 VITALS — Ht 64.0 in | Wt 230.0 lb

## 2021-07-22 DIAGNOSIS — Z1211 Encounter for screening for malignant neoplasm of colon: Secondary | ICD-10-CM

## 2021-07-22 MED ORDER — NA SULFATE-K SULFATE-MG SULF 17.5-3.13-1.6 GM/177ML PO SOLN: 1.0000 | Freq: Once | ORAL | 0 refills | Status: AC

## 2021-07-22 NOTE — Progress Notes (Signed)
Denies allergies to eggs or soy products. Denies complication of anesthesia or sedation. Denies use of weight loss medication. Denies use of O2.   Emmi instructions given for colonoscopy.  

## 2021-08-05 ENCOUNTER — Other Ambulatory Visit: Payer: Self-pay

## 2021-08-05 ENCOUNTER — Ambulatory Visit (AMBULATORY_SURGERY_CENTER): Payer: BC Managed Care – PPO | Admitting: Gastroenterology

## 2021-08-05 ENCOUNTER — Encounter: Payer: Self-pay | Admitting: Gastroenterology

## 2021-08-05 VITALS — BP 131/76 | HR 53 | Temp 97.4°F | Resp 18 | Ht 64.0 in | Wt 235.0 lb

## 2021-08-05 DIAGNOSIS — Z1211 Encounter for screening for malignant neoplasm of colon: Secondary | ICD-10-CM | POA: Diagnosis not present

## 2021-08-05 DIAGNOSIS — D125 Benign neoplasm of sigmoid colon: Secondary | ICD-10-CM | POA: Diagnosis not present

## 2021-08-05 DIAGNOSIS — D124 Benign neoplasm of descending colon: Secondary | ICD-10-CM

## 2021-08-05 DIAGNOSIS — D123 Benign neoplasm of transverse colon: Secondary | ICD-10-CM | POA: Diagnosis not present

## 2021-08-05 DIAGNOSIS — K5140 Inflammatory polyps of colon without complications: Secondary | ICD-10-CM | POA: Diagnosis not present

## 2021-08-05 MED ORDER — SODIUM CHLORIDE 0.9 % IV SOLN: 500.0000 mL | INTRAVENOUS | Status: DC

## 2021-08-05 NOTE — Progress Notes (Signed)
To Pacu, VSS. Report to [email or web address] ?

## 2021-08-05 NOTE — Progress Notes (Signed)
Pt's states no medical or surgical changes since previsit or office visit. 

## 2021-08-05 NOTE — Patient Instructions (Signed)
Resume previous medications.  Await results for final recommendations.  Handouts given to patient on polyps.  No aspirin, Naproxen, ibuprofen or other non steroidal inflammatories for 2 WEEKS after polyp removal.   YOU HAD AN ENDOSCOPIC PROCEDURE TODAY AT [facility]:   Refer to the procedure report that was given to you for any specific questions about what was found during the examination.  If the procedure report does not answer your questions, please call your gastroenterologist to clarify.  If you requested that your care partner not be given the details of your procedure findings, then the procedure report has been included in a sealed envelope for you to review at your convenience later.  YOU SHOULD EXPECT: Some feelings of bloating in the abdomen. Passage of more gas than usual.  Walking can help get rid of the air that was put into your GI tract during the procedure and reduce the bloating. If you had a lower endoscopy (such as a colonoscopy or flexible sigmoidoscopy) you may notice spotting of blood in your stool or on the toilet paper. If you underwent a bowel prep for your procedure, you may not have a normal bowel movement for a few days.  Please Note:  You might notice some irritation and congestion in your nose or some drainage.  This is from the oxygen used during your procedure.  There is no need for concern and it should clear up in a day or so.  SYMPTOMS TO REPORT IMMEDIATELY:  Following lower endoscopy (colonoscopy or flexible sigmoidoscopy):  Excessive amounts of blood in the stool  Significant tenderness or worsening of abdominal pains  Swelling of the abdomen that is new, acute  Fever of 100F or higher  For urgent or emergent issues, a gastroenterologist can be reached at any hour by calling [phone]. Do not use MyChart messaging for urgent concerns.    DIET:  We do recommend a small meal at first, but then you may proceed to your regular diet.   Drink plenty of fluids but you should avoid alcoholic beverages for 24 hours.  ACTIVITY:  You should plan to take it easy for the rest of today and you should NOT DRIVE or use heavy machinery until tomorrow (because of the sedation medicines used during the test).    FOLLOW UP: Our staff will call the number listed on your records 48-72 hours following your procedure to check on you and address any questions or concerns that you may have regarding the information given to you following your procedure. If we do not reach you, we will leave a message.  We will attempt to reach you two times.  During this call, we will ask if you have developed any symptoms of COVID 19. If you develop any symptoms (ie: fever, flu-like symptoms, shortness of breath, cough etc.) before then, please call [phone].  If you test positive for Covid 19 in the 2 weeks post procedure, please call and report this information to [place].    If any biopsies were taken you will be contacted by phone or by letter within the next 1-3 weeks.  Please call us at [phone] if you have not heard about the biopsies in 3 weeks.    SIGNATURES/CONFIDENTIALITY: You and/or your care partner have signed paperwork which will be entered into your electronic medical record.  These signatures attest to the fact that that the information above on your After Visit Summary has been reviewed and is understood.  Full  responsibility of the confidentiality of this discharge information lies with you and/or your care-partner.

## 2021-08-05 NOTE — Progress Notes (Signed)
History & Physical  Primary Care Physician:  Ronita Hipps, MD Primary Gastroenterologist: Lucio Edward, MD  CHIEF COMPLAINT:  CRC screening  HPI: Kathy Livingston is a 51 y.o. female CRC screening, average risk, for colonoscopy.   Past Medical History:  Diagnosis Date   Allergy    PONV (postoperative nausea and vomiting)    N&V FOR 3 HOURS AFTER EPIDURAL FOR C-SECTION.   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 Authorizing Provider  escitalopram (LEXAPRO) 10 MG tablet Take 10 mg by mouth daily. 10/07/18  Yes [provider]  levonorgestrel (MIRENA, 52 MG,) 20 MCG/DAY IUD Mirena 21 mcg/24 hours (8 yrs) 52 mg intrauterine device   Yes [provider]  levothyroxine (SYNTHROID) 200 MCG tablet Take 1 tablet (200 mcg total) by mouth daily before breakfast. 04/11/21  Yes Renato Shin, MD  acidophilus (RISAQUAD) CAPS Take 2 capsules by mouth daily. Patient not taking: Reported on 07/22/2021    [provider]  gabapentin (NEURONTIN) 300 MG capsule Take 300 mg by mouth every evening. 11/16/18   [provider]  Pseudoephedrine HCl (SUDAFED PO) Take 30 mg by mouth as needed. ONCE A DAY IF NEEDED FOR ALLERGIES    [provider]    Current Outpatient Medications  Medication Sig Dispense Refill   escitalopram (LEXAPRO) 10 MG tablet Take 10 mg by mouth daily.     levonorgestrel (MIRENA, 52 MG,) 20 MCG/DAY IUD Mirena 21 mcg/24 hours (8 yrs) 52 mg intrauterine device     levothyroxine (SYNTHROID) 200 MCG tablet Take 1 tablet  (200 mcg total) by mouth daily before breakfast. 60 tablet 3   acidophilus (RISAQUAD) CAPS Take 2 capsules by mouth daily. (Patient not taking: Reported on 07/22/2021)     gabapentin (NEURONTIN) 300 MG capsule Take 300 mg by mouth every evening.     Pseudoephedrine HCl (SUDAFED PO) Take 30 mg by mouth as needed. ONCE A DAY IF NEEDED FOR ALLERGIES     Current Facility-Administered Medications  Medication Dose Route Frequency Provider Last Rate Last Admin   0.9 %  sodium chloride infusion  500 mL Intravenous Continuous Ladene Artist, MD        Allergies as of 08/05/2021   (No Known Allergies)    Family History  Problem Relation Age of Onset   Cancer Father        lymphoma   Heart disease Paternal Grandmother    Cancer Paternal Grandfather        pancreatic and esophageal   Heart disease Paternal Grandfather    Alcohol abuse Daughter    Cancer Other        Breast Cancer-Grandparent   Hypertension Other        Parent   Thyroid disease Neg Hx    Colon cancer Neg Hx    Esophageal cancer Neg Hx    Rectal cancer Neg Hx    Stomach cancer Neg Hx     Social History   Socioeconomic History   Marital status: Single  Spouse name: Not on file   Number of children: 2   Years of education: 49   Highest education level: Not on file  Occupational History   Occupation: Product manager: [employer]  Tobacco Use   Smoking status: Never   Smokeless tobacco: Never  Substance and Sexual Activity   Alcohol use: Yes    Comment: OCCAS   Drug use: No   Sexual activity: Not on file  Other Topics Concern   Not on file  Social History Narrative   Regular exercise-yes   Caffeine Use-yes   Social Determinants of Health   Financial Resource Strain: Not on file  Food Insecurity: Not on file  Transportation Needs: Not on file  Physical Activity: Not on file  Stress: Not on file  Social Connections: Not on file  Intimate Partner Violence: Not on file    Review of  Systems:  All systems reviewed an negative except where noted in HPI.  Gen: Denies any fever, chills, sweats, anorexia, fatigue, weakness, malaise, weight loss, and sleep disorder CV: Denies chest pain, angina, palpitations, syncope, orthopnea, PND, peripheral edema, and claudication. Resp: Denies dyspnea at rest, dyspnea with exercise, cough, sputum, wheezing, coughing up blood, and pleurisy. GI: Denies vomiting blood, jaundice, and fecal incontinence.   Denies dysphagia or odynophagia. GU : Denies urinary burning, blood in urine, urinary frequency, urinary hesitancy, nocturnal urination, and urinary incontinence. MS: Denies joint pain, limitation of movement, and swelling, stiffness, low back pain, extremity pain. Denies muscle weakness, cramps, atrophy.  Derm: Denies rash, itching, dry skin, hives, moles, warts, or unhealing ulcers.  Psych: Denies depression, anxiety, memory loss, suicidal ideation, hallucinations, paranoia, and confusion. Heme: Denies bruising, bleeding, and enlarged lymph nodes. Neuro:  Denies any headaches, dizziness, paresthesias. Endo:  Denies any problems with DM, thyroid, adrenal function.   Physical Exam: General:  Alert, well-developed, in NAD Head:  Normocephalic and atraumatic. Eyes:  Sclera clear, no icterus.   Conjunctiva pink. Ears:  Normal auditory acuity. Mouth:  No deformity or lesions.  Neck:  Supple; no masses . Lungs:  Clear throughout to auscultation.   No wheezes, crackles, or rhonchi. No acute distress. Heart:  Regular rate and rhythm; no murmurs. Abdomen:  Soft, nondistended, nontender. No masses, hepatomegaly. No obvious masses.  Normal bowel .    Rectal:  Deferred   Msk:  Symmetrical without gross deformities.. Pulses:  Normal pulses noted. Extremities:  Without edema. Neurologic:  Alert and  oriented x4;  grossly normal neurologically. Skin:  Intact without significant lesions or rashes. Cervical Nodes:  No significant cervical  adenopathy. Psych:  Alert and cooperative. Normal mood and affect.   Impression / Plan:   CRC screening, average risk, for colonoscopy.   Pricilla Riffle. Fuller Plan  08/05/2021, 9:10 AM See Shea Evans,  GI, to contact our on call provider

## 2021-08-05 NOTE — Progress Notes (Signed)
Called to room to assist during endoscopic procedure.  Patient ID and intended procedure confirmed with present staff. Received instructions for my participation in the procedure from the performing physician.  

## 2021-08-05 NOTE — Op Note (Signed)
[facility] Patient Name: Kathy Livingston Procedure Date: 08/05/2021 8:40 AM MRN: [id number] Endoscopist: Ladene Artist , MD Age: 51 Referring MD:  Date of Birth: [birth year] Gender: Female Account #: [id number] Procedure:                Colonoscopy Indications:              Screening for colorectal malignant neoplasm Medicines:                Monitored Anesthesia Care Procedure:                Pre-Anesthesia Assessment:                           - Prior to the procedure, a History and Physical                            was performed, and patient medications and                            allergies were reviewed. The patient's tolerance of                            previous anesthesia was also reviewed. The risks                            and benefits of the procedure and the sedation                            options and risks were discussed with the patient.                            All questions were answered, and informed consent                            was obtained. Prior Anticoagulants: The patient has                            taken no previous anticoagulant or antiplatelet                            agents. ASA Grade Assessment: III - A patient with                            severe systemic disease. After reviewing the risks                            and benefits, the patient was deemed in                            satisfactory condition to undergo the procedure.                           After obtaining informed consent, the colonoscope  was passed under direct vision. Throughout the                            procedure, the patient's blood pressure, pulse, and                            oxygen saturations were monitored continuously. The                            CF [id number] #[id number] was introduced through the anus                            and advanced to the the cecum, identified by                            appendiceal  orifice and ileocecal valve. The                            ileocecal valve, appendiceal orifice, and rectum                            were photographed. The quality of the bowel                            preparation was good. The colonoscopy was performed                            without difficulty. The patient tolerated the                            procedure well. Scope In: 9:23:02 AM Scope Out: 9:43:14 AM Scope Withdrawal Time: 0 hours 17 minutes 29 seconds  Total Procedure Duration: 0 hours 20 minutes 12 seconds  Findings:                 The perianal and digital rectal examinations were                            normal.                           A 25 mm polyp was found in the distal sigmoid                            colon. The polyp was pedunculated. The polyp was                            removed with a hot snare. Resection and retrieval                            were complete. Two areas tattooed 5 cm proximal to                            the polypectomy site with injections of 3 mL of  Spot (carbon black) total.                           Two pedunculated and semi-pedunculated polyps were                            found in the descending colon and transverse colon.                            The polyps were 8 to 9 mm in size. These polyps                            were removed with a cold snare. Resection and                            retrieval were complete.                           The exam was otherwise without abnormality on                            direct and retroflexion views. Complications:            No immediate complications. Estimated blood loss:                            None. Estimated Blood Loss:     Estimated blood loss: none. Impression:               - One 25 mm polyp in the sigmoid colon, removed                            with a hot snare. Resected and retrieved. Tattooed.                           - Two 8 to 9 mm  polyps in the descending colon and                            in the transverse colon, removed with a cold snare.                            Resected and retrieved.                           - The examination was otherwise normal on direct                            and retroflexion views. Recommendation:           - Repeat colonoscopy after studies are complete for                            surveillance based on pathology results.                           -  Patient has a contact number available for                            emergencies. The signs and symptoms of potential                            delayed complications were discussed with the                            patient. Return to normal activities tomorrow.                            Written discharge instructions were provided to the                            patient.                           - Resume previous diet.                           - Continue present medications.                           - Await pathology results.                           - No aspirin, ibuprofen, naproxen, or other                            non-steroidal anti-inflammatory drugs for 2 weeks                            after polyp removal. Ladene Artist, MD 08/05/2021 9:51:23 AM This report has been signed electronically.

## 2021-08-07 ENCOUNTER — Telehealth: Payer: Self-pay | Admitting: *Deleted

## 2021-08-07 NOTE — Telephone Encounter (Signed)
°  Follow up Call-  Call back number 08/05/2021  Post procedure Call Back phone  # [phone]  Permission to leave phone message Yes  Some recent data might be hidden     Patient questions:  Do you have a fever, pain , or abdominal swelling? No. Pain Score  0 *  Have you tolerated food without any problems? Yes.    Have you been able to return to your normal activities? Yes.    Do you have any questions about your discharge instructions: Diet   No. Medications  No. Follow up visit  No.  Do you have questions or concerns about your Care? No.  Actions: * If pain score is 4 or above: No action needed, pain <4.

## 2021-08-16 ENCOUNTER — Encounter: Payer: Self-pay | Admitting: Gastroenterology

## 2021-10-22 ENCOUNTER — Ambulatory Visit: Payer: BC Managed Care – PPO | Admitting: Endocrinology

## 2021-10-22 VITALS — BP 138/90 | HR 71 | Ht 64.0 in | Wt 239.6 lb

## 2021-10-22 DIAGNOSIS — E890 Postprocedural hypothyroidism: Secondary | ICD-10-CM | POA: Diagnosis not present

## 2021-10-22 LAB — TSH: TSH: 10.96 u[IU]/mL — ABNORMAL HIGH (ref 0.35–5.50)

## 2021-10-22 LAB — VITAMIN D 25 HYDROXY (VIT D DEFICIENCY, FRACTURES): VITD: 14.42 ng/mL — ABNORMAL LOW (ref 30.00–100.00)

## 2021-10-22 LAB — T4, FREE: Free T4: 0.76 ng/dL (ref 0.60–1.60)

## 2021-10-22 NOTE — Patient Instructions (Signed)
blood tests are requested for you today.  We'll let you know about the results.   ?You should have an endocrinology follow-up appointment in 1 year.   ? ? ?  ?

## 2021-10-22 NOTE — Progress Notes (Signed)
? ?Subjective:  ? ? Patient ID: Kathy Livingston, female    DOB: [birth year], 51 y.o.   MRN: [id number] ? 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  She says she never misses synthroid. pt states she does not take a dedicated Vit-D supplement.  Hypocalcemia was noted in ER last night.  She still has abd pain.   ?Past Medical History:  ?Diagnosis Date  ? Allergy   ? PONV (postoperative nausea and vomiting)   ? N&V FOR 3 HOURS AFTER EPIDURAL FOR C-SECTION.  ? Seasonal allergies   ? PT IS HOARSE--ATTRIBUTES TO HER ALLERGIES  ? Thyroid cancer ([place]) 12/06/2011  ? Papillary, 0.6cm, T1a  ? ? ?Past Surgical History:  ?Procedure Laterality Date  ? BIOPSY THYROID  04/16/11  ? CESAREAN SECTION  09/22/00, 01/11/04  ? THYROIDECTOMY  12/16/2011  ? Procedure: THYROIDECTOMY;  Surgeon: Gayland Curry, MD,FACS;  Location: [facility];  Service: General;  Laterality: N/A;  ? ? ?Social History  ? ?Socioeconomic History  ? Marital status: Single  ?  Spouse name: Not on file  ? Number of children: 2  ? Years of education: 4  ? Highest education level: Not on file  ?Occupational History  ? Occupation: Pharmacist, hospital  ?  Employer: [employer]  ?Tobacco Use  ? Smoking status: Never  ? Smokeless tobacco: Never  ?Substance and Sexual Activity  ? Alcohol use: Yes  ?  Comment: OCCAS  ? Drug use: No  ? Sexual activity: Not on file  ?Other Topics Concern  ? Not on file  ?Social History Narrative  ? Regular exercise-yes  ? Caffeine Use-yes  ? ?Social Determinants of Health  ? ?Financial Resource Strain: Not on file  ?Food Insecurity: Not on file  ?Transportation Needs: Not on file  ?Physical Activity: Not on file  ?Stress: Not on file  ?Social Connections: Not on file  ?Intimate  Partner Violence: Not on file  ? ? ?Current Outpatient Medications on File Prior to Visit  ?Medication Sig Dispense Refill  ? acidophilus (RISAQUAD) CAPS Take 2 capsules by mouth daily.    ? escitalopram (LEXAPRO) 10 MG tablet Take 10 mg by mouth daily.    ? gabapentin (NEURONTIN) 300 MG capsule Take 300 mg by mouth every evening.    ? levonorgestrel (MIRENA, 52 MG,) 20 MCG/DAY IUD Mirena 21 mcg/24 hours (8 yrs) 52 mg intrauterine device    ? Pseudoephedrine HCl (SUDAFED PO) Take 30 mg by mouth as needed. ONCE A DAY IF NEEDED FOR ALLERGIES    ? ?No current facility-administered medications on file prior to visit.  ? ? ?No Known Allergies ? ?Family History  ?Problem Relation Age of Onset  ? Cancer Father   ?     lymphoma  ? Heart disease Paternal Grandmother   ? Cancer Paternal Grandfather   ?     pancreatic and esophageal  ? Heart disease Paternal Grandfather   ? Alcohol abuse Daughter   ? Cancer Other   ?     Breast Cancer-Grandparent  ? Hypertension Other   ?     Parent  ? Thyroid disease Neg Hx   ? Colon cancer Neg Hx   ? Esophageal cancer Neg Hx   ? Rectal  cancer Neg Hx   ? Stomach cancer Neg Hx   ? ? ?BP 138/90 (BP Location: Left Arm, Patient Position: Sitting, Cuff Size: Normal)   Pulse 71   Ht '5\' 4"'$  (1.626 m)   Wt 239 lb 9.6 oz (108.7 kg)   SpO2 98%   BMI 41.13 kg/m?  ? ?Review of Systems ? ?   ?Objective:  ? Physical Exam ?VITAL SIGNS:  See vs page ?GENERAL: no distress ?Neck: a healed scar is present.  I do not appreciate a nodule in the thyroid or elsewhere in the neck.   ? ? ?25-OH Vit-D=17 ?TSH=10 ?   ?Assessment & Plan:  ?Vit-D def: I advised Vit-D, 5000 units per day ?Hypothyroidism: uncontrolled.  I have sent a prescription to your pharmacy, to increase synthroid. ?

## 2021-10-23 ENCOUNTER — Other Ambulatory Visit: Payer: Self-pay | Admitting: Obstetrics and Gynecology

## 2021-10-23 ENCOUNTER — Telehealth (INDEPENDENT_AMBULATORY_CARE_PROVIDER_SITE_OTHER): Payer: Self-pay

## 2021-10-23 DIAGNOSIS — R928 Other abnormal and inconclusive findings on diagnostic imaging of breast: Secondary | ICD-10-CM

## 2021-10-23 LAB — PTH, INTACT AND CALCIUM
Calcium: 8.6 mg/dL (ref 8.6–10.4)
PTH: 32 pg/mL (ref 16–77)

## 2021-10-23 MED ORDER — LEVOTHYROXINE SODIUM 112 MCG PO TABS: 224.0000 ug | ORAL_TABLET | Freq: Every day | ORAL | 1 refills | Status: DC

## 2021-10-23 NOTE — Telephone Encounter (Signed)
Pt LVM stating that she had been to the ED in [facility] and they referred her to another [facility]. She would like to be seen here. I called pt back and explained that she would need to have the ED or her PCP send [place] a referral. Once I received it, I would process and call her to get scheduled. Nothing further needed at this time.  ?

## 2021-11-06 ENCOUNTER — Other Ambulatory Visit: Payer: Self-pay | Admitting: Obstetrics and Gynecology

## 2021-11-06 ENCOUNTER — Ambulatory Visit: Payer: BC Managed Care – PPO

## 2021-11-06 ENCOUNTER — Ambulatory Visit
Admission: RE | Admit: 2021-11-06 | Discharge: 2021-11-06 | Disposition: A | Discharge status: Home / Self Care | Payer: BC Managed Care – PPO | Source: Ambulatory Visit | Attending: Obstetrics and Gynecology | Admitting: Obstetrics and Gynecology

## 2021-11-06 DIAGNOSIS — N6320 Unspecified lump in the left breast, unspecified quadrant: Secondary | ICD-10-CM

## 2021-11-06 DIAGNOSIS — R928 Other abnormal and inconclusive findings on diagnostic imaging of breast: Secondary | ICD-10-CM

## 2021-11-13 ENCOUNTER — Ambulatory Visit
Admission: RE | Admit: 2021-11-13 | Discharge: 2021-11-13 | Disposition: A | Discharge status: Home / Self Care | Payer: BC Managed Care – PPO | Source: Ambulatory Visit | Attending: Obstetrics and Gynecology | Admitting: Obstetrics and Gynecology

## 2021-11-13 DIAGNOSIS — N6320 Unspecified lump in the left breast, unspecified quadrant: Secondary | ICD-10-CM

## 2021-12-17 ENCOUNTER — Encounter (INDEPENDENT_AMBULATORY_CARE_PROVIDER_SITE_OTHER): Payer: Self-pay | Admitting: Vascular Surgery

## 2021-12-17 ENCOUNTER — Encounter (INDEPENDENT_AMBULATORY_CARE_PROVIDER_SITE_OTHER): Payer: Self-pay

## 2022-02-26 ENCOUNTER — Ambulatory Visit: Payer: BC Managed Care – PPO | Admitting: Endocrinology

## 2022-04-21 ENCOUNTER — Other Ambulatory Visit (INDEPENDENT_AMBULATORY_CARE_PROVIDER_SITE_OTHER): Payer: Self-pay | Admitting: Nurse Practitioner

## 2022-04-21 DIAGNOSIS — I771 Stricture of artery: Secondary | ICD-10-CM

## 2022-04-23 ENCOUNTER — Encounter (INDEPENDENT_AMBULATORY_CARE_PROVIDER_SITE_OTHER): Payer: Self-pay | Admitting: Nurse Practitioner

## 2022-04-23 ENCOUNTER — Encounter (INDEPENDENT_AMBULATORY_CARE_PROVIDER_SITE_OTHER): Payer: Self-pay

## 2022-05-05 ENCOUNTER — Encounter (INDEPENDENT_AMBULATORY_CARE_PROVIDER_SITE_OTHER): Payer: Self-pay

## 2022-05-15 ENCOUNTER — Telehealth: Payer: Self-pay | Admitting: Internal Medicine

## 2022-05-15 DIAGNOSIS — E890 Postprocedural hypothyroidism: Secondary | ICD-10-CM

## 2022-05-15 MED ORDER — LEVOTHYROXINE SODIUM 112 MCG PO TABS: 224.0000 ug | ORAL_TABLET | Freq: Every day | ORAL | 0 refills | Status: DC

## 2022-05-15 NOTE — Addendum Note (Signed)
Addended by: Lauralyn Primes on: 05/15/2022 01:26 PM   Modules accepted: Orders

## 2022-05-15 NOTE — Telephone Encounter (Signed)
OK  - needs an appt w/in 6 mo

## 2022-05-15 NOTE — Telephone Encounter (Signed)
MEDICATION:  levothyroxine levothyroxine (SYNTHROID) 112 MCG tablet  PHARMACY:    [id number] [place], [place] - [address] (Ph: [phone]    HAS THE PATIENT CONTACTED THEIR PHARMACY?  Yes  IS THIS A 90 DAY SUPPLY : Yes  IS PATIENT OUT OF MEDICATION: Yes  IF NOT; HOW MUCH IS LEFT:   LAST APPOINTMENT DATE: @ 10/22/2021  NEXT APPOINTMENT DATE:@ Patient will call to schedule follow up.  DO WE HAVE YOUR PERMISSION TO LEAVE A DETAILED MESSAGE?:Yes  OTHER COMMENTS: Patient will call office to schedule appointment at a  later date.   **Let patient know to contact pharmacy at the end of the day to make sure medication is ready. **  ** Please notify patient to allow 48-72 hours to process**  **Encourage patient to contact the pharmacy for refills or they [facility]*

## 2022-05-15 NOTE — Telephone Encounter (Signed)
Rx sent to preferred pharmacy.

## 2022-07-25 IMAGING — US US BREAST BX W LOC DEV 1ST LESION IMG BX SPEC US GUIDE*L*
1 series · 13 of 17 positions shown · non-contrast
Comparison: None Available.
COMPARISON: None Available.

Addendum:
CLINICAL DATA: 51-year-old female presenting for biopsy of a mass
in the left breast.

EXAM:
ULTRASOUND GUIDED LEFT BREAST CORE NEEDLE BIOPSY

[Series 1: us breast bx w loc dev 1st lesion img bx spec us g · 0.06mm/px · 13 of 17 slices shown]
[im 1/17]
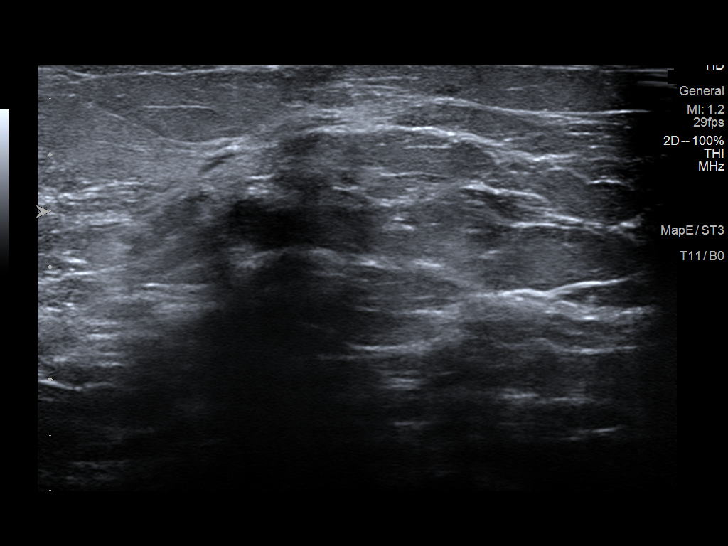
[im 2/17]
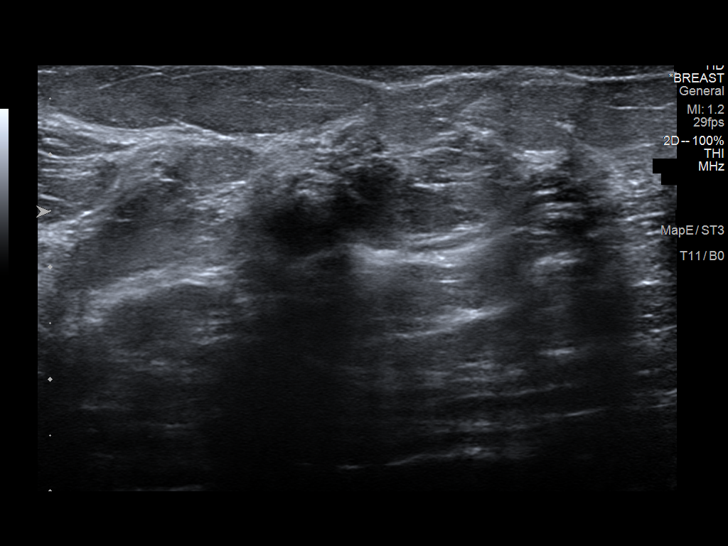
[im 4/17]
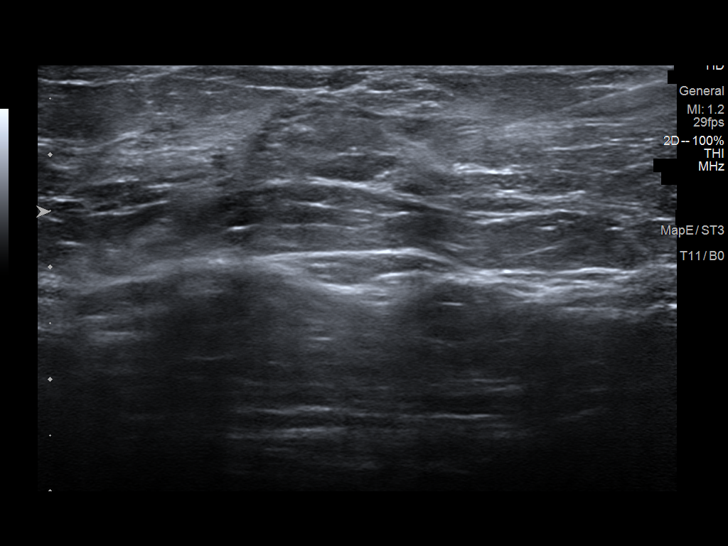
[im 5/17]
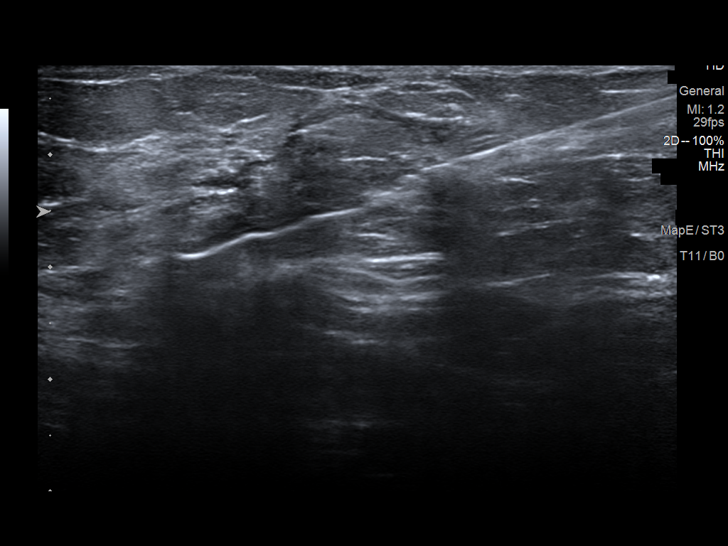
[im 6/17]
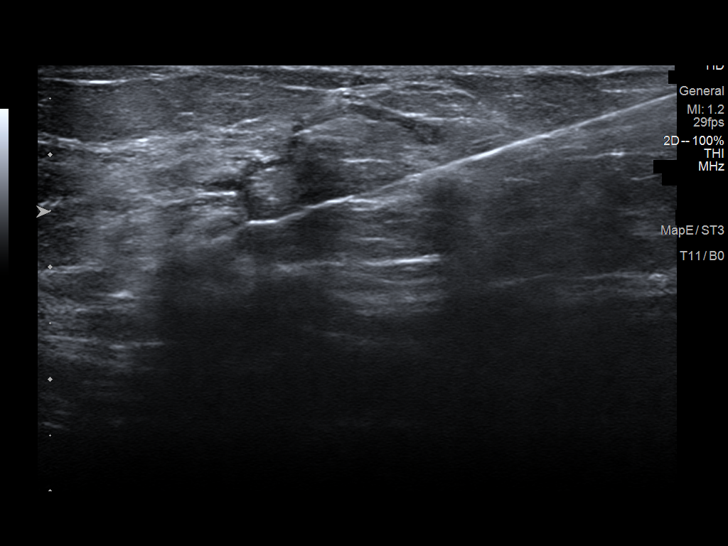
[im 8/17]
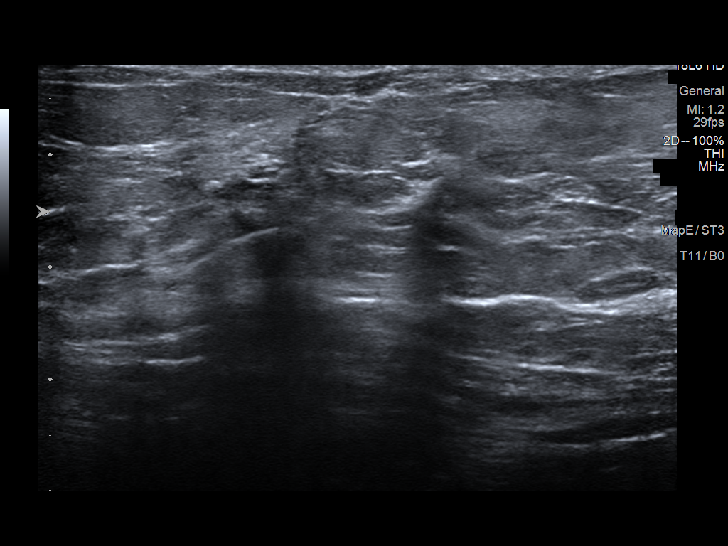
[im 9/17]
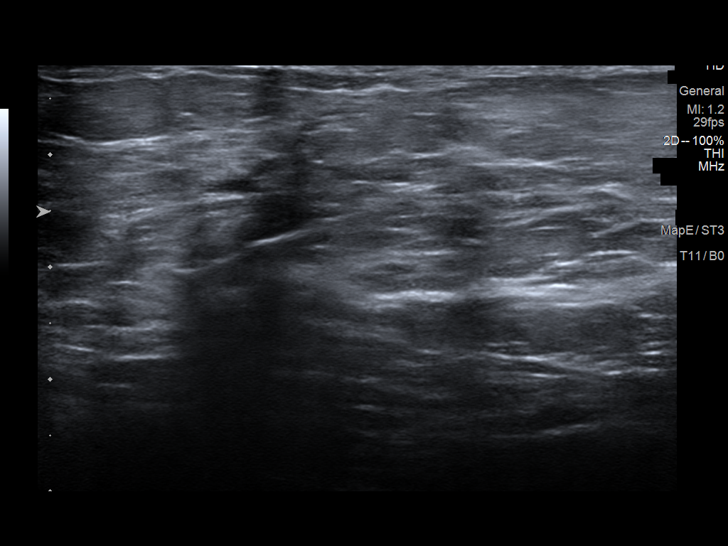
[im 10/17]
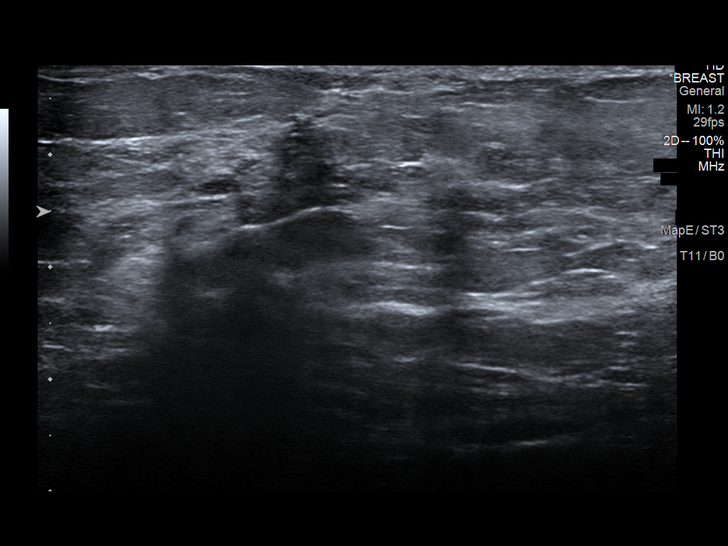
[im 12/17]
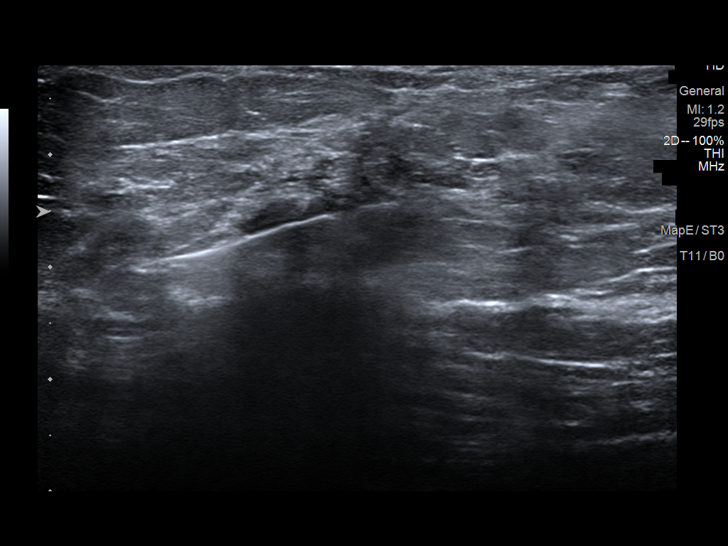
[im 13/17]
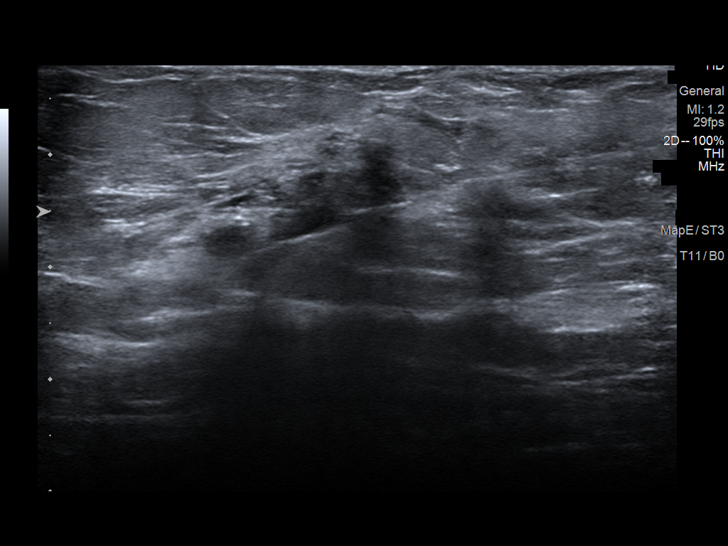
[im 14/17]
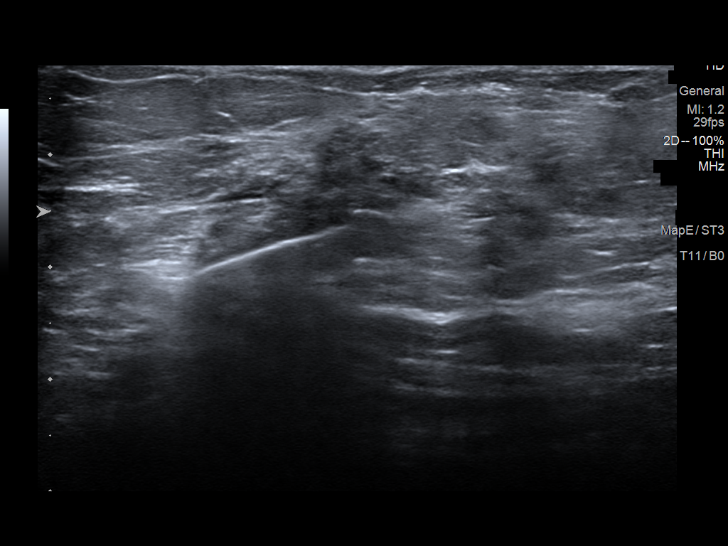
[im 16/17]
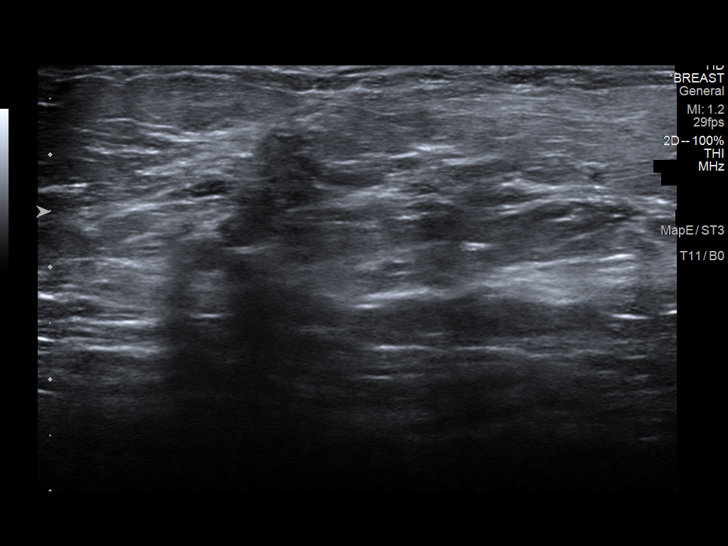
[im 17/17]
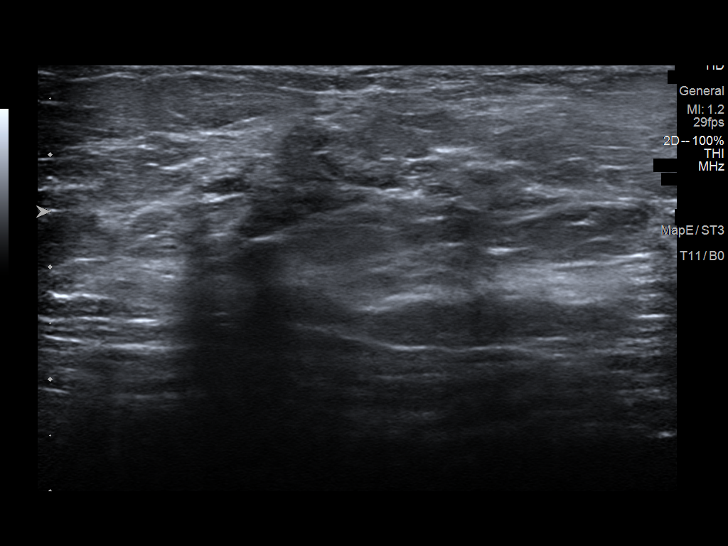

[13 of 17 positions shown; findings below may reference images not displayed]



Lesion quadrant: Lower outer quadrant

Using sterile technique and 1% Lidocaine as local anesthetic, under
direct ultrasound visualization, a 14 gauge Mbrak device was
used to perform biopsy of a mass in the left breast at 4 o'clock
using a lateral approach. At the conclusion of the procedure a
ribbon tissue marker clip was deployed into the biopsy cavity.
Follow up 2 view mammogram was performed and dictated separately.
IMPRESSION: Ultrasound guided biopsy of a mass in the left breast at 4 o'clock.
No apparent complications.

ADDENDUM:
Pathology revealed BENIGN FIBROADENOMATOID NODULE of the LEFT
breast, 4:00 o'clock, 5 cmfn, (ribbon clip). This was found to be
concordant by Dr. David Oscar Anabalon.

Pathology results were discussed with the patient by telephone. The
patient reported doing well after the biopsy with tenderness at the
site. Post biopsy instructions and care were reviewed and questions
were answered. The patient was encouraged to call The [REDACTED]

The patient was instructed to return for annual screening
mammography in November 2022.

Pathology results reported by Davida Tiger, RN on 11/15/2021.



Lesion quadrant: Lower outer quadrant

Using sterile technique and 1% Lidocaine as local anesthetic, under
direct ultrasound visualization, a 14 gauge Mbrak device was
used to perform biopsy of a mass in the left breast at 4 o'clock
using a lateral approach. At the conclusion of the procedure a
ribbon tissue marker clip was deployed into the biopsy cavity.
Follow up 2 view mammogram was performed and dictated separately.
IMPRESSION: Ultrasound guided biopsy of a mass in the left breast at 4 o'clock.
No apparent complications.

## 2022-07-25 IMAGING — MG MM BREAST LOCALIZATION CLIP
4 series · 4 of 12 positions shown · non-contrast
Comparison: Previous exam(s).

CLINICAL DATA: Post procedure mammogram for clip placement

EXAM:
3D DIAGNOSTIC LEFT MAMMOGRAM POST ULTRASOUND BIOPSY

[L ML synth-2D]
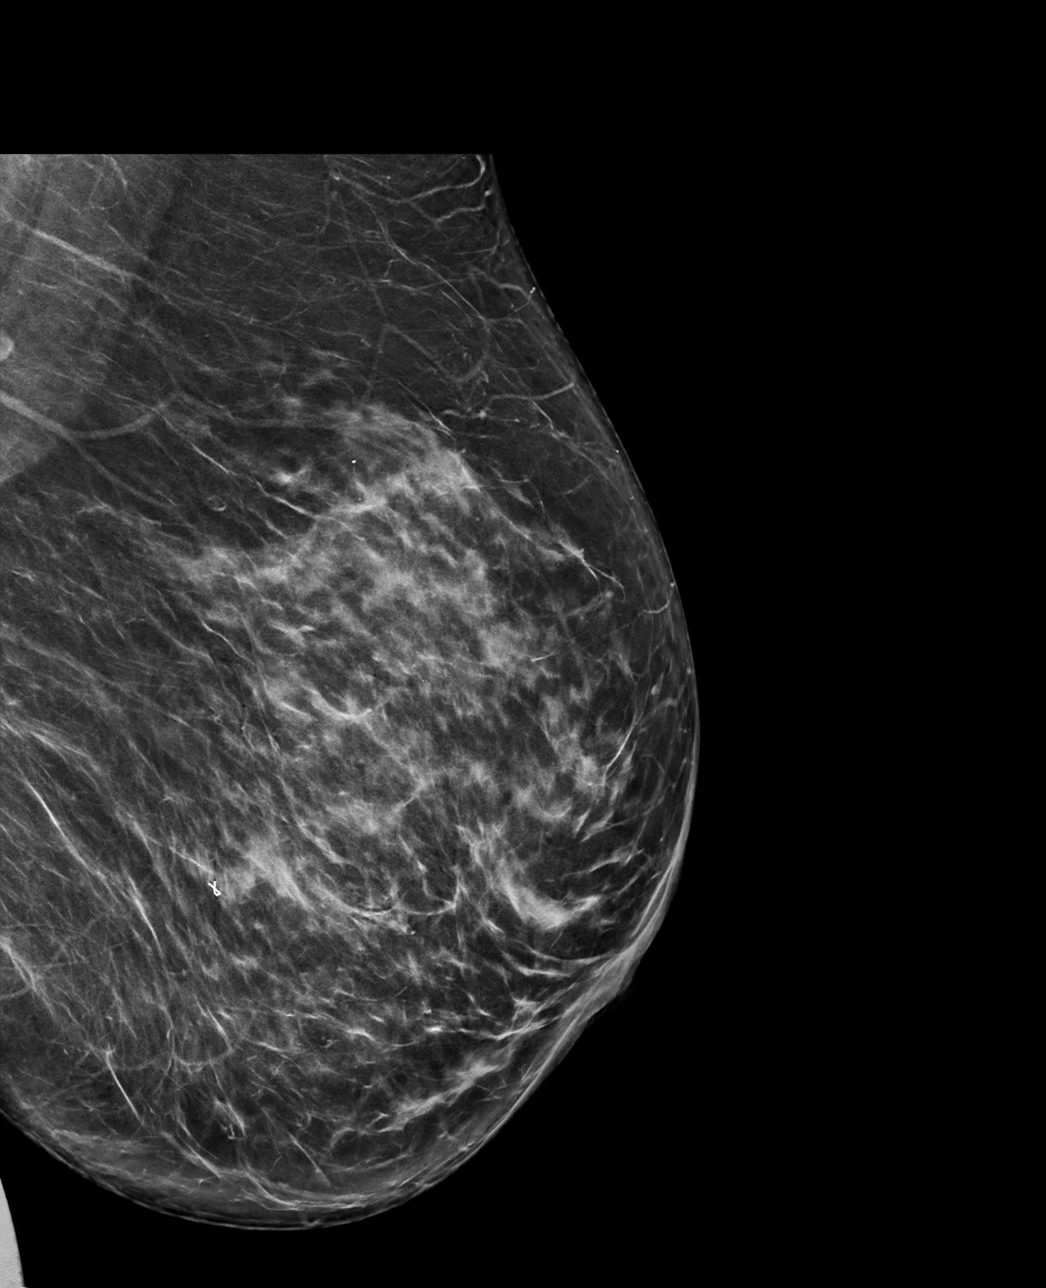

[L CC synth-2D]
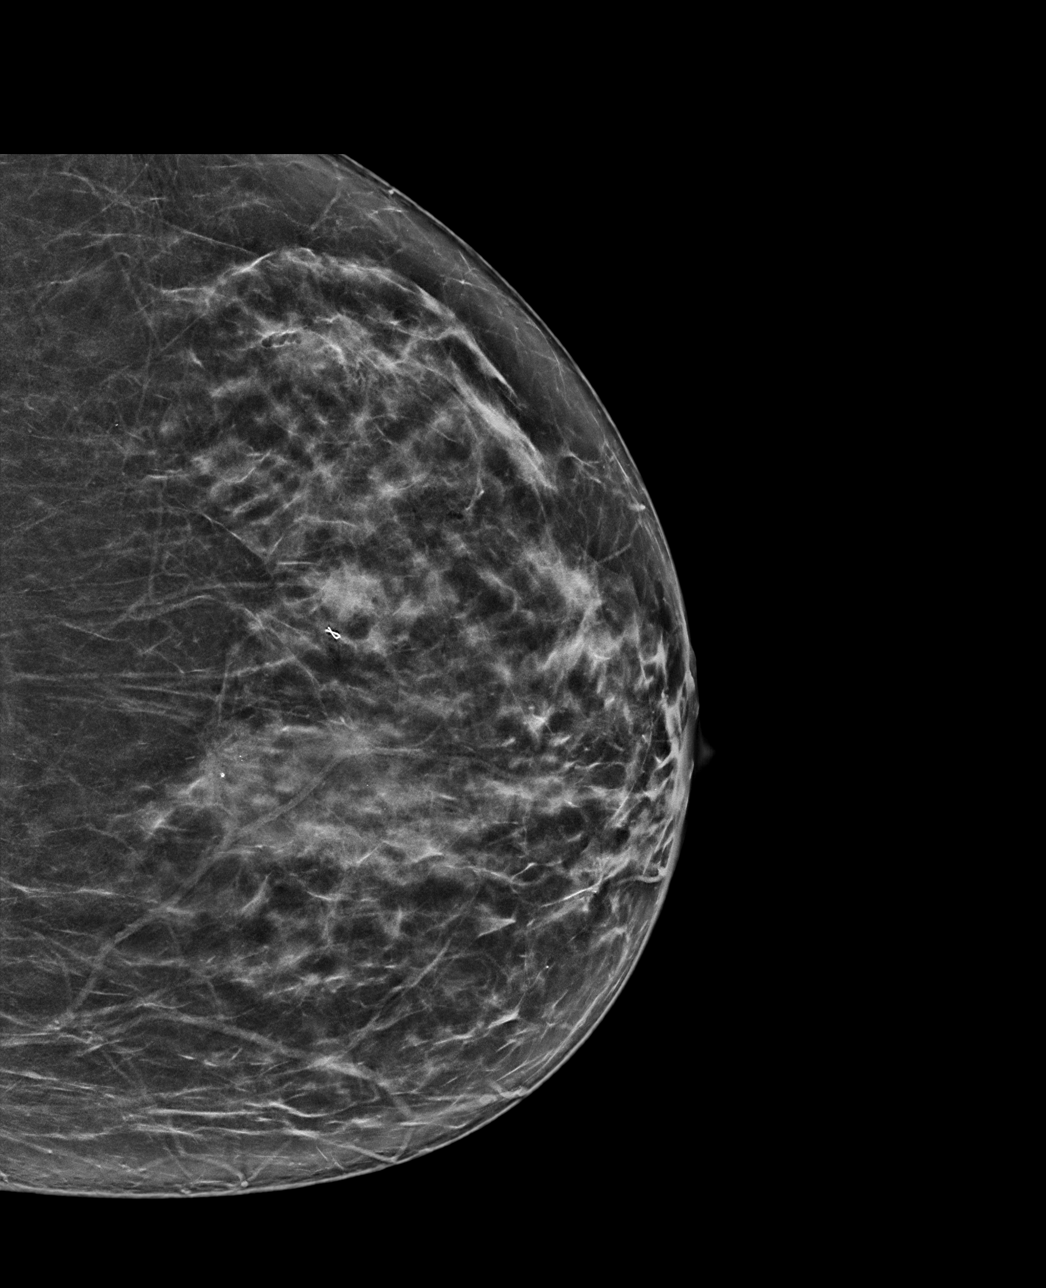

[L CC tomo · tomo slice 38/75.0]
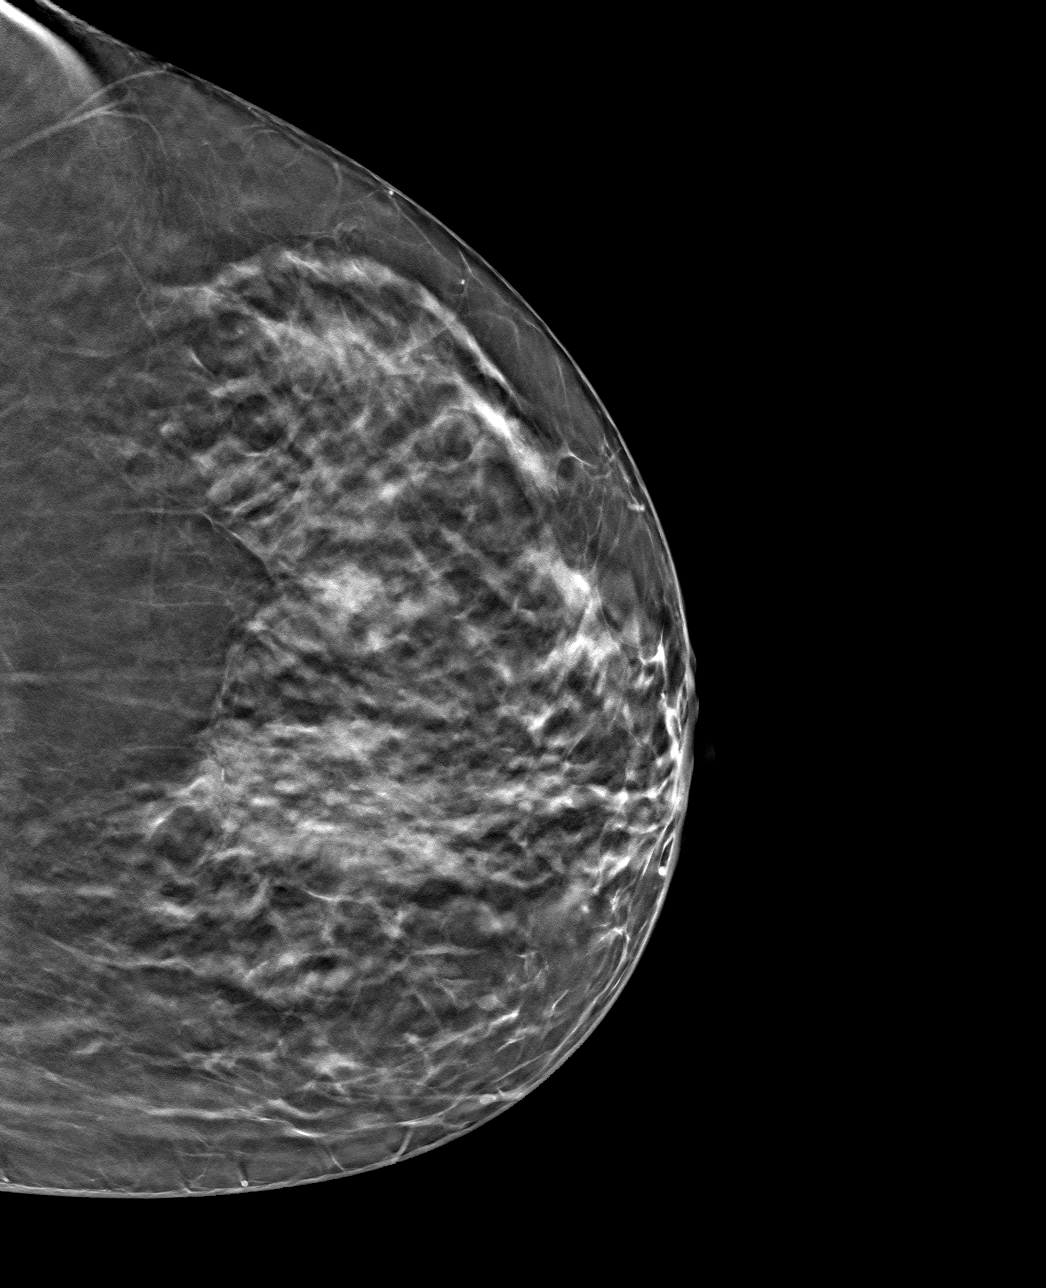

[L ML tomo · tomo slice 42/83.0]
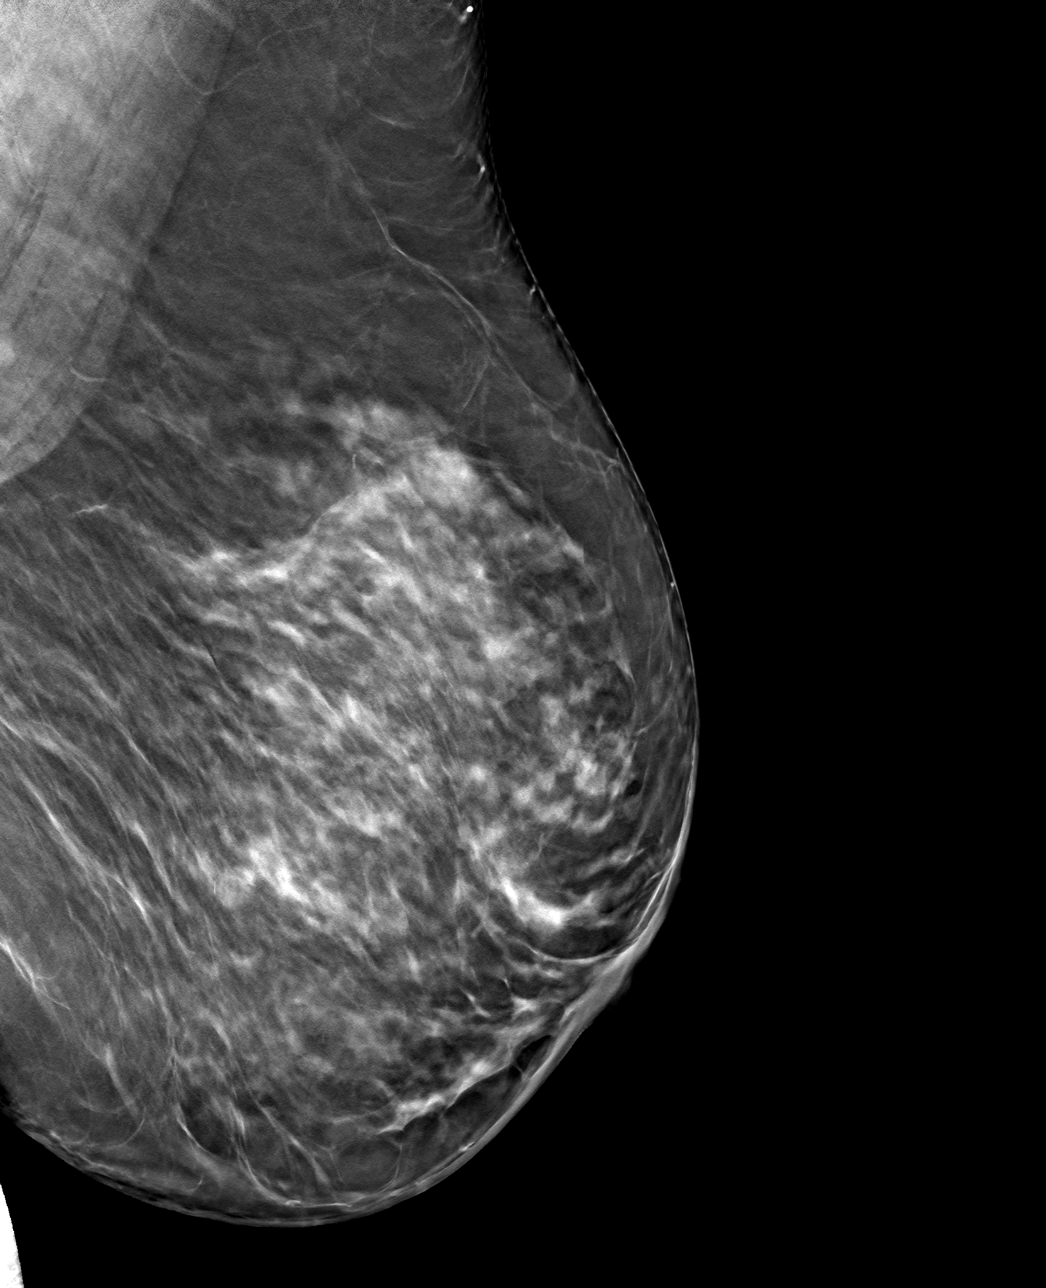

[4 of 12 positions shown; findings below may reference images not displayed]

FINDINGS: 3D Mammographic images were obtained following ultrasound guided
biopsy of a mass in the left breast at 4 o'clock. The biopsy marking
clip is in expected position at the site of biopsy.
IMPRESSION: Appropriate positioning of the ribbon shaped biopsy marking clip at
the site of biopsy in the left breast at 4 o'clock.

Final Assessment: Post Procedure Mammograms for Marker Placement

## 2022-08-10 ENCOUNTER — Other Ambulatory Visit: Payer: Self-pay | Admitting: Internal Medicine

## 2022-08-10 DIAGNOSIS — E890 Postprocedural hypothyroidism: Secondary | ICD-10-CM
